# Patient Record
Sex: Female | Born: 1960 | Race: White | Hispanic: No | Marital: Married | Smoking: Former smoker
Health system: Southern US, Community
[De-identification: ages and names within clinical notes are randomized; demographics above are authoritative.]

## PROBLEM LIST (undated history)

## (undated) DIAGNOSIS — J449 Chronic obstructive pulmonary disease, unspecified: Secondary | ICD-10-CM

## (undated) DIAGNOSIS — G40909 Epilepsy, unspecified, not intractable, without status epilepticus: Secondary | ICD-10-CM

## (undated) DIAGNOSIS — C801 Malignant (primary) neoplasm, unspecified: Secondary | ICD-10-CM

## (undated) DIAGNOSIS — R0600 Dyspnea, unspecified: Secondary | ICD-10-CM

## (undated) DIAGNOSIS — G4730 Sleep apnea, unspecified: Secondary | ICD-10-CM

## (undated) DIAGNOSIS — M1990 Unspecified osteoarthritis, unspecified site: Secondary | ICD-10-CM

## (undated) DIAGNOSIS — J189 Pneumonia, unspecified organism: Secondary | ICD-10-CM

## (undated) DIAGNOSIS — E119 Type 2 diabetes mellitus without complications: Secondary | ICD-10-CM

## (undated) HISTORY — PX: BREAST BIOPSY: SHX20

## (undated) HISTORY — PX: CHOLECYSTECTOMY: SHX55

## (undated) HISTORY — PX: COLONOSCOPY: SHX174

## (undated) HISTORY — PX: APPENDECTOMY: SHX54

## (undated) HISTORY — PX: ABDOMINAL HYSTERECTOMY: SHX81

## (undated) HISTORY — PX: JOINT REPLACEMENT: SHX530

---

## 2003-06-19 ENCOUNTER — Other Ambulatory Visit: Payer: Self-pay

## 2003-10-16 ENCOUNTER — Encounter (HOSPITAL_COMMUNITY): Admission: RE | Admit: 2003-10-16 | Discharge: 2003-11-15 | Payer: Self-pay | Admitting: Unknown Physician Specialty

## 2004-09-09 ENCOUNTER — Ambulatory Visit (HOSPITAL_COMMUNITY): Admission: RE | Admit: 2004-09-09 | Discharge: 2004-09-09 | Payer: Self-pay | Admitting: Obstetrics and Gynecology

## 2007-07-29 DIAGNOSIS — F418 Other specified anxiety disorders: Secondary | ICD-10-CM | POA: Insufficient documentation

## 2007-08-08 ENCOUNTER — Ambulatory Visit: Payer: Self-pay | Admitting: Family Medicine

## 2010-08-21 ENCOUNTER — Ambulatory Visit: Payer: Self-pay | Admitting: Family

## 2011-11-11 DIAGNOSIS — N951 Menopausal and female climacteric states: Secondary | ICD-10-CM | POA: Insufficient documentation

## 2011-11-11 DIAGNOSIS — I890 Lymphedema, not elsewhere classified: Secondary | ICD-10-CM | POA: Insufficient documentation

## 2011-11-25 ENCOUNTER — Ambulatory Visit: Payer: Self-pay

## 2013-06-19 ENCOUNTER — Ambulatory Visit: Payer: Self-pay

## 2013-06-23 ENCOUNTER — Ambulatory Visit: Payer: Self-pay

## 2014-01-16 ENCOUNTER — Ambulatory Visit: Payer: Self-pay | Admitting: Unknown Physician Specialty

## 2014-02-09 ENCOUNTER — Ambulatory Visit: Payer: Self-pay | Admitting: Unknown Physician Specialty

## 2014-04-09 ENCOUNTER — Ambulatory Visit: Payer: Self-pay | Admitting: Physician Assistant

## 2014-08-17 DIAGNOSIS — R519 Headache, unspecified: Secondary | ICD-10-CM | POA: Insufficient documentation

## 2014-08-23 ENCOUNTER — Ambulatory Visit: Payer: Self-pay | Admitting: Physician Assistant

## 2016-03-04 ENCOUNTER — Other Ambulatory Visit: Payer: Self-pay | Admitting: Physician Assistant

## 2016-03-04 DIAGNOSIS — M79661 Pain in right lower leg: Secondary | ICD-10-CM

## 2016-03-04 DIAGNOSIS — M7989 Other specified soft tissue disorders: Principal | ICD-10-CM

## 2016-03-05 ENCOUNTER — Ambulatory Visit
Admission: RE | Admit: 2016-03-05 | Discharge: 2016-03-05 | Disposition: A | Discharge status: Home / Self Care | Payer: BLUE CROSS/BLUE SHIELD | Source: Ambulatory Visit | Attending: Physician Assistant | Admitting: Physician Assistant

## 2016-03-05 DIAGNOSIS — M79661 Pain in right lower leg: Secondary | ICD-10-CM

## 2016-03-05 DIAGNOSIS — M7989 Other specified soft tissue disorders: Secondary | ICD-10-CM | POA: Diagnosis present

## 2018-02-23 DIAGNOSIS — I10 Essential (primary) hypertension: Secondary | ICD-10-CM | POA: Insufficient documentation

## 2018-02-28 ENCOUNTER — Other Ambulatory Visit: Payer: Self-pay

## 2018-02-28 ENCOUNTER — Ambulatory Visit: Payer: Self-pay | Attending: Oncology

## 2018-02-28 ENCOUNTER — Ambulatory Visit
Admission: RE | Admit: 2018-02-28 | Discharge: 2018-02-28 | Disposition: A | Discharge status: Home / Self Care | Payer: Self-pay | Source: Ambulatory Visit | Attending: Oncology | Admitting: Oncology

## 2018-02-28 VITALS — BP 115/72 | HR 72 | Temp 98.1°F | Ht 65.0 in | Wt 309.0 lb

## 2018-02-28 DIAGNOSIS — N63 Unspecified lump in unspecified breast: Secondary | ICD-10-CM

## 2018-02-28 DIAGNOSIS — Z0000 Encounter for general adult medical examination without abnormal findings: Secondary | ICD-10-CM | POA: Insufficient documentation

## 2018-02-28 NOTE — Progress Notes (Unsigned)
Letter mailed from [facility] to notify of normal mammogram results.  Patient to return in one year for annual screening.  Copy to [facility]. 

## 2018-02-28 NOTE — Progress Notes (Signed)
  Subjective:     Patient ID: Cheryl Silva, female   DOB: 08/07/1960, 57 y.o.   MRN: [id number]  HPI   Review of Systems     Objective:   Physical Exam  Pulmonary/Chest: Right breast exhibits no inverted nipple, no mass, no nipple discharge, no skin change and no tenderness. Left breast exhibits skin change. Left breast exhibits no inverted nipple, no mass, no nipple discharge and no tenderness. Breasts are symmetrical.  Left breast biopsy scar         Assessment:     57 year old patient presents for [facility] clinic visit.  Patient screened, and meets BCCCP eligibility.  Patient does not have insurance, Medicare or Medicaid.  Handout given on Affordable Care Act.  Instructed patient on breast self awareness using teach back method.  Clinical breast exam unremarkable.  No mass or lump palpated.  Patient states her usband is disabled from a car accident about 6 years ago.  She is his full-time caregiver.  Risk Assessment    Risk Scores      02/28/2018   Last edited by: Theodore Demark, RN   5-year risk: 1.2 %   Lifetime risk: 7.5 %            Plan:     Sent for bilateral screening mammogram.

## 2018-07-18 ENCOUNTER — Ambulatory Visit
Admission: RE | Admit: 2018-07-18 | Discharge: 2018-07-18 | Disposition: A | Discharge status: Home / Self Care | Payer: BLUE CROSS/BLUE SHIELD | Source: Ambulatory Visit | Attending: Physician Assistant | Admitting: Physician Assistant

## 2018-07-18 ENCOUNTER — Other Ambulatory Visit: Payer: Self-pay | Admitting: Physician Assistant

## 2018-07-18 ENCOUNTER — Encounter (INDEPENDENT_AMBULATORY_CARE_PROVIDER_SITE_OTHER): Payer: Self-pay

## 2018-07-18 DIAGNOSIS — M7989 Other specified soft tissue disorders: Secondary | ICD-10-CM | POA: Diagnosis present

## 2018-07-18 DIAGNOSIS — M79661 Pain in right lower leg: Secondary | ICD-10-CM | POA: Insufficient documentation

## 2019-01-23 ENCOUNTER — Other Ambulatory Visit: Payer: Self-pay | Admitting: Physician Assistant

## 2019-01-23 DIAGNOSIS — Z1231 Encounter for screening mammogram for malignant neoplasm of breast: Secondary | ICD-10-CM

## 2020-01-31 ENCOUNTER — Other Ambulatory Visit: Payer: Self-pay | Admitting: Physician Assistant

## 2020-01-31 DIAGNOSIS — Z1231 Encounter for screening mammogram for malignant neoplasm of breast: Secondary | ICD-10-CM

## 2020-02-28 ENCOUNTER — Other Ambulatory Visit: Payer: Self-pay

## 2020-02-28 ENCOUNTER — Ambulatory Visit
Admission: RE | Admit: 2020-02-28 | Discharge: 2020-02-28 | Disposition: A | Discharge status: Home / Self Care | Payer: BC Managed Care – PPO | Source: Ambulatory Visit | Attending: Physician Assistant | Admitting: Physician Assistant

## 2020-02-28 DIAGNOSIS — Z1231 Encounter for screening mammogram for malignant neoplasm of breast: Secondary | ICD-10-CM | POA: Diagnosis not present

## 2020-06-15 HISTORY — PX: MASTECTOMY: SHX3

## 2020-06-20 ENCOUNTER — Other Ambulatory Visit: Payer: Self-pay

## 2020-06-20 ENCOUNTER — Emergency Department (HOSPITAL_COMMUNITY)
Admission: EM | Admit: 2020-06-20 | Discharge: 2020-06-20 | Disposition: A | Discharge status: Home / Self Care | Payer: BC Managed Care – PPO | Attending: Emergency Medicine | Admitting: Emergency Medicine

## 2020-06-20 ENCOUNTER — Encounter (HOSPITAL_COMMUNITY): Payer: Self-pay | Admitting: Emergency Medicine

## 2020-06-20 DIAGNOSIS — Z87891 Personal history of nicotine dependence: Secondary | ICD-10-CM | POA: Insufficient documentation

## 2020-06-20 DIAGNOSIS — E119 Type 2 diabetes mellitus without complications: Secondary | ICD-10-CM | POA: Insufficient documentation

## 2020-06-20 DIAGNOSIS — U071 COVID-19: Secondary | ICD-10-CM

## 2020-06-20 DIAGNOSIS — M791 Myalgia, unspecified site: Secondary | ICD-10-CM | POA: Diagnosis present

## 2020-06-20 HISTORY — DX: Type 2 diabetes mellitus without complications: E11.9

## 2020-06-20 HISTORY — DX: Epilepsy, unspecified, not intractable, without status epilepticus: G40.909

## 2020-06-20 LAB — POC SARS CORONAVIRUS 2 AG -  ED: SARS Coronavirus 2 Ag: POSITIVE — AB

## 2020-06-20 MED ORDER — BENZONATATE 100 MG PO CAPS: 100.0000 mg | ORAL_CAPSULE | Freq: Three times a day (TID) | ORAL | 0 refills | Status: AC

## 2020-06-20 MED ORDER — ALBUTEROL SULFATE HFA 108 (90 BASE) MCG/ACT IN AERS
2.0000 | INHALATION_SPRAY | Freq: Once | RESPIRATORY_TRACT | Status: AC
  Administered 2020-06-20: 2 via RESPIRATORY_TRACT
  Filled 2020-06-20: qty 6.7

## 2020-06-20 NOTE — ED Triage Notes (Signed)
Pt c/o shortness of breath with ambulation. Pt states she has been sick with a cough and fatigue for the last 8 days.

## 2020-06-20 NOTE — Discharge Instructions (Signed)
Take cough medication as directed. You can use two puffs of the albuterol inhaler  every 4-6 hours as needed for shortness of breath.  ° °Stay well hydrated.  ° °Rotate tylenol and motrin for fevers.  ° °You should be isolated for at least 7 days since the onset of your symptoms AND >72 hours after symptoms resolution (absence of fever without the use of fever reducing medicaiton and improvement in respiratory symptoms), whichever is longer ° °Please follow up with your primary care provider within 5-7 days for re-evaluation of your symptoms. If you do not have a primary care provider, information for a healthcare clinic has been provided for you to make arrangements for follow up care. Please return to the emergency department for any new or worsening symptoms. ° °

## 2020-06-20 NOTE — ED Provider Notes (Signed)
[facility] EMERGENCY DEPARTMENT Provider Note   CSN: [id number] Arrival date & time: 06/20/20  1443     History Chief Complaint  Patient presents with  . Shortness of Breath    Cheryl Silva is a 60 y.o. female.  HPI   60 year old female with a history of diabetes, epilepsy, who presents to the emergency department today for evaluation of URI symptoms.  States she started having a headache 8 days ago.  Since then she has developed body aches, fevers, fatigue, cough and shortness of breath.  She has had decreased p.o. intake as well as some diarrhea.  There is been no vomiting.  She has been vaccinated with the [employer] vaccine.  She is not sure if she has had any direct Covid exposures but states her daughter is a Marine scientist and had a headache prior to the onset of her symptoms with the daughter was not tested for Covid.  Past Medical History:  Diagnosis Date  . Diabetes mellitus without complication ([place])   . Epilepsy ([place])     There are no problems to display for this patient.   Past Surgical History:  Procedure Laterality Date  . ABDOMINAL HYSTERECTOMY    . APPENDECTOMY    . BREAST BIOPSY Right    benign 15 years ago  . CHOLECYSTECTOMY       OB History   No obstetric history on file.     Family History  Problem Relation Age of Onset  . Breast cancer Maternal Aunt 59    Social History   Tobacco Use  . Smoking status: Former Smoker    Packs/day: 1.00    Years: 15.00    Pack years: 15.00    Types: Cigarettes  . Smokeless tobacco: Never Used  . Tobacco comment: quit 20 years ago  Vaping Use  . Vaping Use: Never used  Substance Use Topics  . Alcohol use: Not Currently  . Drug use: Not Currently    Home Medications Prior to Admission medications   Medication Sig Start Date End Date Taking? Authorizing Provider  benzonatate (TESSALON) 100 MG capsule Take 1 capsule (100 mg total) by mouth every 8 (eight) hours for 5 days. 06/20/20 06/25/20 Yes Naomii Kreger,  Lilliana Turner S, PA-C    Allergies    Vicodin [hydrocodone-acetaminophen]  Review of Systems   Review of Systems  Constitutional: Positive for appetite change, chills, fatigue and fever.  HENT: Negative for ear pain.   Eyes: Negative for pain and visual disturbance.  Respiratory: Positive for cough and shortness of breath.   Cardiovascular: Negative for chest pain.  Gastrointestinal: Positive for diarrhea. Negative for abdominal pain, constipation, nausea and vomiting.  Genitourinary: Negative for dysuria and hematuria.  Musculoskeletal: Positive for myalgias. Negative for back pain.  Skin: Negative for rash.  Neurological: Positive for weakness (generally weak). Negative for seizures and syncope.  All other systems reviewed and are negative.   Physical Exam Updated Vital Signs BP (!) 143/83 (BP Location: Right Arm)   Pulse 78   Temp 98.5 F (36.9 C) (Oral)   Resp 17   Ht 5\' 5"  (1.651 m)   Wt 134.9 kg   SpO2 97%   BMI 49.51 kg/m   Physical Exam Vitals and nursing note reviewed.  Constitutional:      General: She is not in acute distress.    Appearance: She is well-developed and well-nourished.  HENT:     Head: Normocephalic and atraumatic.  Eyes:     Conjunctiva/sclera: Conjunctivae normal.  Cardiovascular:     Rate and Rhythm: Normal rate and regular rhythm.     Heart sounds: Normal heart sounds. No murmur heard.   Pulmonary:     Effort: Pulmonary effort is normal. No respiratory distress.     Breath sounds: Normal breath sounds. No decreased breath sounds, wheezing, rhonchi or rales.  Abdominal:     Palpations: Abdomen is soft.     Tenderness: There is no abdominal tenderness.  Musculoskeletal:        General: No edema.     Cervical back: Neck supple.  Skin:    General: Skin is warm and dry.  Neurological:     Mental Status: She is alert.  Psychiatric:        Mood and Affect: Mood and affect normal.     ED Results / Procedures / Treatments   Labs (all  labs ordered are listed, but only abnormal results are displayed) Labs Reviewed  POC SARS CORONAVIRUS 2 AG -  ED - Abnormal; Notable for the following components:      Result Value   SARS Coronavirus 2 Ag POSITIVE (*)    All other components within normal limits    EKG None  Radiology No results found.  Procedures Procedures (including critical care time)  Medications Ordered in ED Medications  albuterol (VENTOLIN HFA) 108 (90 Base) MCG/ACT inhaler 2 puff (has no administration in time range)    ED Course  I have reviewed the triage vital signs and the nursing notes.  Pertinent labs & imaging results that were available during my care of the patient were reviewed by me and considered in my medical decision making (see chart for details).    MDM Rules/Calculators/A&P                          Patient presenting for evaluation for Covid.  Reports symptoms ongoing for 8 days.  Patient nontoxic, well-appearing, no distress.  Vital signs are reassuring.   Tested for Covid in the ED. Results positive. Ambulated pt in the room on pulse ox and sats stayed at 96% on RA.  Advised on quarantine measures. Will give Rx for symptomatic management. Advised on f/u and return precautions. Pt voiced understanding of the plan and reasons to return. All questions answered, pt stable for d/c.  Cheryl Silva was evaluated in Emergency Department on 06/20/2020 for the symptoms described in the history of present illness. She was evaluated in the context of the global COVID-19 pandemic, which necessitated consideration that the patient might be at risk for infection with the SARS-CoV-2 virus that causes COVID-19. Institutional protocols and algorithms that pertain to the evaluation of patients at risk for COVID-19 are in a state of rapid change based on information released by regulatory bodies including the CDC and federal and state organizations. These policies and algorithms were followed during the patient's  care in the ED.   Final Clinical Impression(s) / ED Diagnoses Final diagnoses:  COVID    Rx / DC Orders ED Discharge Orders         Ordered    benzonatate (TESSALON) 100 MG capsule  Every 8 hours        06/20/20 1544           Orra Nolde S, PA-C 06/20/20 1548    Mancel Bale, MD 06/20/20 2329

## 2020-06-21 ENCOUNTER — Emergency Department (HOSPITAL_COMMUNITY)
Admission: EM | Admit: 2020-06-21 | Discharge: 2020-06-22 | Disposition: A | Discharge status: Home / Self Care | Payer: BC Managed Care – PPO | Attending: Emergency Medicine | Admitting: Emergency Medicine

## 2020-06-21 ENCOUNTER — Emergency Department (HOSPITAL_COMMUNITY): Payer: BC Managed Care – PPO

## 2020-06-21 ENCOUNTER — Encounter (HOSPITAL_COMMUNITY): Payer: Self-pay

## 2020-06-21 ENCOUNTER — Other Ambulatory Visit: Payer: Self-pay

## 2020-06-21 DIAGNOSIS — Z87891 Personal history of nicotine dependence: Secondary | ICD-10-CM | POA: Diagnosis not present

## 2020-06-21 DIAGNOSIS — E860 Dehydration: Secondary | ICD-10-CM | POA: Diagnosis not present

## 2020-06-21 DIAGNOSIS — U071 COVID-19: Secondary | ICD-10-CM

## 2020-06-21 DIAGNOSIS — E119 Type 2 diabetes mellitus without complications: Secondary | ICD-10-CM | POA: Diagnosis not present

## 2020-06-21 DIAGNOSIS — R197 Diarrhea, unspecified: Secondary | ICD-10-CM | POA: Diagnosis present

## 2020-06-21 LAB — COMPREHENSIVE METABOLIC PANEL
ALT: 23 U/L (ref 0–44)
AST: 19 U/L (ref 15–41)
Albumin: 3.7 g/dL (ref 3.5–5.0)
Alkaline Phosphatase: 67 U/L (ref 38–126)
Anion gap: 10 (ref 5–15)
BUN: 11 mg/dL (ref 6–20)
CO2: 22 mmol/L (ref 22–32)
Calcium: 8.7 mg/dL — ABNORMAL LOW (ref 8.9–10.3)
Chloride: 108 mmol/L (ref 98–111)
Creatinine, Ser: 0.67 mg/dL (ref 0.44–1.00)
GFR, Estimated: >60 mL/min (ref >60)
Glucose, Bld: 193 mg/dL — ABNORMAL HIGH (ref 70–99)
Potassium: 3.7 mmol/L (ref 3.5–5.1)
Sodium: 140 mmol/L (ref 135–145)
Total Bilirubin: 0.9 mg/dL (ref 0.3–1.2)
Total Protein: 6.8 g/dL (ref 6.5–8.1)

## 2020-06-21 LAB — CBC WITH DIFFERENTIAL/PLATELET
Abs Immature Granulocytes: 0.02 10*3/uL (ref 0.00–0.07)
Basophils Absolute: 0 10*3/uL (ref 0.0–0.1)
Basophils Relative: 0 %
Eosinophils Absolute: 0.2 10*3/uL (ref 0.0–0.5)
Eosinophils Relative: 3 %
HCT: 43.6 % (ref 36.0–46.0)
Hemoglobin: 14 g/dL (ref 12.0–15.0)
Immature Granulocytes: 0 %
Lymphocytes Relative: 19 %
Lymphs Abs: 1.3 10*3/uL (ref 0.7–4.0)
MCH: 30.2 pg (ref 26.0–34.0)
MCHC: 32.1 g/dL (ref 30.0–36.0)
MCV: 94 fL (ref 80.0–100.0)
Monocytes Absolute: 0.5 10*3/uL (ref 0.1–1.0)
Monocytes Relative: 7 %
Neutro Abs: 4.9 10*3/uL (ref 1.7–7.7)
Neutrophils Relative %: 71 %
Platelets: 162 10*3/uL (ref 150–400)
RBC: 4.64 MIL/uL (ref 3.87–5.11)
RDW: 12.5 % (ref 11.5–15.5)
WBC: 6.9 10*3/uL (ref 4.0–10.5)
nRBC: 0 % (ref 0.0–0.2)

## 2020-06-21 MED ORDER — LACTATED RINGERS IV BOLUS
500.0000 mL | Freq: Once | INTRAVENOUS | Status: AC
  Administered 2020-06-21: 500 mL via INTRAVENOUS

## 2020-06-21 NOTE — ED Notes (Signed)
Pt ambulated O2 sats 97% did not drop. Pt also stated felt much better than before

## 2020-06-21 NOTE — ED Triage Notes (Signed)
Pt to er, pt states that she was here last night and dx with covid, states that she was told that if her O2 saturation went down to come back, states that her O2 sat has been as low as 77, states that most of the day it has been between 88 and 94, states that she gets more sob with exertion.

## 2020-06-21 NOTE — Discharge Instructions (Signed)
Today your blood sugar was high at 193, however the rest of your blood work was reassuring.  Suspect you are dehydrated from not drinking enough water to make up for the diarrhea that you are having.  Please increase your fluid intake.  Sodas and milk do not rehydrate you.  Ginger ale may help settle your stomach however otherwise I would recommend water, half water half Gatorade mix or half water half juice mix.    If your oxygen is staying below 90% then return to the emergency room.  Please give your body time to adjust with any position change.

## 2020-06-21 NOTE — ED Notes (Signed)
Pt ambulated to restroom with 02 staying at 95%. Pt still complained of SOB while walking.

## 2020-06-21 NOTE — ED Notes (Signed)
Pt ambulated from waiting room to pt room 8. O2 sats 95%

## 2020-06-21 NOTE — ED Provider Notes (Signed)
[facility] EMERGENCY DEPARTMENT Provider Note   CSN: [id number] Arrival date & time: 06/21/20  1912     History Chief Complaint  Patient presents with  . Shortness of Breath     Cheryl Silva is a 60 y.o. female with a past medical history of epilepsy, DM, who presents today for evaluation of concern for hypoxia. She was seen yesterday for COVID-19. Today she is on day 9 of symptoms. She got the J&J vaccine however did not get any boosters. She states that she has been checking her oxygen at home and it has been as low as 77 at points during the day. She reports that she is continuing to have diarrhea. She states that anytime she eats anything she has diarrhea. She states she feels dizzy when she stands.  She reports compliance with all of her medications.  She denies significant chest pain. No leg swelling.  HPI     Past Medical History:  Diagnosis Date  . Diabetes mellitus without complication ([facility])   . Epilepsy ([facility])     There are no problems to display for this patient.   Past Surgical History:  Procedure Laterality Date  . ABDOMINAL HYSTERECTOMY    . APPENDECTOMY    . BREAST BIOPSY Right    benign 15 years ago  . CHOLECYSTECTOMY       OB History   No obstetric history on file.     Family History  Problem Relation Age of Onset  . Breast cancer Maternal Aunt 66    Social History   Tobacco Use  . Smoking status: Former Smoker    Packs/day: 1.00    Years: 15.00    Pack years: 15.00    Types: Cigarettes  . Smokeless tobacco: Never Used  . Tobacco comment: quit 20 years ago  Vaping Use  . Vaping Use: Never used  Substance Use Topics  . Alcohol use: Not Currently  . Drug use: Not Currently    Home Medications Prior to Admission medications   Medication Sig Start Date End Date Taking? Authorizing Provider  albuterol (VENTOLIN HFA) 108 (90 Base) MCG/ACT inhaler Inhale into the lungs.   Yes [provider]  benzonatate (TESSALON) 100 MG  capsule Take 1 capsule (100 mg total) by mouth every 8 (eight) hours for 5 days. 06/20/20 06/25/20 Yes Couture, Cortni S, PA-C  fluticasone (FLOVENT HFA) 220 MCG/ACT inhaler Inhale 2 puffs into the lungs daily as needed.   Yes [provider]  gabapentin (NEURONTIN) 300 MG capsule Take 1 capsule by mouth daily. 03/21/19  Yes [provider]  JARDIANCE 10 MG TABS tablet Take 10 mg by mouth daily. 05/24/20  Yes [provider]  losartan (COZAAR) 25 MG tablet Take 1 tablet by mouth daily. 03/21/19  Yes [provider]  sertraline (ZOLOFT) 100 MG tablet Take 150 mg by mouth at bedtime.   Yes [provider]  furosemide (LASIX) 20 MG tablet Take by mouth. Patient not taking: No sig reported 04/10/19   [provider]  montelukast (SINGULAIR) 10 MG tablet  07/18/18   [provider]    Allergies    Vicodin [hydrocodone-acetaminophen]  Review of Systems   Review of Systems  Constitutional: Positive for appetite change and fatigue. Negative for chills and fever.  HENT: Negative for trouble swallowing.   Respiratory: Positive for cough and shortness of breath. Negative for chest tightness.   Cardiovascular: Positive for chest pain (Mild).  Gastrointestinal: Positive for diarrhea and nausea. Negative  for abdominal pain and vomiting.  Musculoskeletal: Positive for arthralgias and myalgias.  Skin: Negative for color change and rash.  Neurological: Positive for light-headedness and headaches. Negative for numbness. Weakness: Global.  Psychiatric/Behavioral: Negative for confusion.  All other systems reviewed and are negative.   Physical Exam Updated Vital Signs BP 130/72 (BP Location: Right Arm)   Pulse 70   Temp 98.4 F (36.9 C)   Resp 18   Ht 5\' 5"  (1.651 m)   Wt 134.7 kg   SpO2 96%   BMI 49.42 kg/m   Physical Exam Vitals and nursing note reviewed.  Constitutional:      General: She is not in acute distress.    Appearance: She  is ill-appearing.  HENT:     Head: Normocephalic and atraumatic.  Eyes:     Conjunctiva/sclera: Conjunctivae normal.  Cardiovascular:     Rate and Rhythm: Normal rate.  Pulmonary:     Effort: Pulmonary effort is normal. No accessory muscle usage or respiratory distress.  Chest:     Chest wall: Tenderness present.  Abdominal:     Palpations: Abdomen is soft.     Tenderness: There is no abdominal tenderness.  Musculoskeletal:     Cervical back: Normal range of motion and neck supple.     Right lower leg: No tenderness.     Left lower leg: No tenderness.  Skin:    General: Skin is warm.  Neurological:     Mental Status: She is alert.     Comments: Awake and alert, answers all questions appropriately.  Speech is not slurred.  Psychiatric:        Mood and Affect: Mood normal.        Behavior: Behavior normal.     ED Results / Procedures / Treatments   Labs (all labs ordered are listed, but only abnormal results are displayed) Labs Reviewed  COMPREHENSIVE METABOLIC PANEL - Abnormal; Notable for the following components:      Result Value   Glucose, Bld 193 (*)    Calcium 8.7 (*)    All other components within normal limits  CBC WITH DIFFERENTIAL/PLATELET    EKG None  Radiology DG Chest Portable 1 View  Result Date: 06/21/2020 CLINICAL DATA:  Shortness of breath.  COVID. EXAM: PORTABLE CHEST 1 VIEW COMPARISON:  None. FINDINGS: Vague opacities in the periphery of both lung bases and right midlung zone. Heart is normal in size. Normal mediastinal contours. No evidence of pneumomediastinum or pneumothorax. No pleural fluid or pulmonary edema. No acute osseous abnormalities are seen. IMPRESSION: Vague bilateral lung opacities, pattern consistent with COVID-19 pneumonia. Electronically Signed   By: Narda Rutherford M.D.   On: 06/21/2020 22:55   Orthostatic VS for the past 24 hrs:  BP- Lying Pulse- Lying BP- Sitting Pulse- Sitting BP- Standing at 0 minutes Pulse- Standing at 0  minutes  06/21/20 2300 129/55 72 139/74 87 -- --  06/21/20 2132 128/60 71 143/70 81 118/71 102      Procedures Procedures (including critical care time)  Medications Ordered in ED Medications  lactated ringers bolus 500 mL (0 mLs Intravenous Stopped 06/21/20 2349)    ED Course  I have reviewed the triage vital signs and the nursing notes.  Pertinent labs & imaging results that were available during my care of the patient were reviewed by me and considered in my medical decision making (see chart for details).    MDM Rules/Calculators/A&P  Cheryl Silva was evaluated in Emergency Department on 06/22/2020 for the symptoms described in the history of present illness. She was evaluated in the context of the global COVID-19 pandemic, which necessitated consideration that the patient might be at risk for infection with the SARS-CoV-2 virus that causes COVID-19. Institutional protocols and algorithms that pertain to the evaluation of patients at risk for COVID-19 are in a state of rapid change based on information released by regulatory bodies including the CDC and federal and state organizations. These policies and algorithms were followed during the patient's care in the ED.  Patient is a 60 year old woman who presents today for concern of hypoxia. She is on day 9 of Covid and reports at home that her pulse oxygen sensor has been into the 70s at times. Labs are obtained and reviewed given her reported significant diarrhea without significant acute hematologic or electrolyte abnormalities. Chest x-ray shows bilateral opacities consistent with Covid. When I originally attempted to have patient stand and ambulate she felt very lightheaded and fatigued very quickly despite not becoming hypoxic. Orthostatics are obtained, she was not significantly orthostatic originally however did have heart rate changes with position changes. She is allowed to drink fluids and given a 500 cc  fluid bolus after which this was repeated. She stated that she felt much better after the p.o. and IV fluids, no longer felt lightheaded when standing, and was able to ambulate with her oxygen sats remaining 97%.  No indication for Covid admission at this time. Recommend to continue p.o. hydration. We also discussed use of pulse oximeter.  Return precautions were discussed with patient who states their understanding.  At the time of discharge patient denied any unaddressed complaints or concerns.  Patient is agreeable for discharge home.  Note: Portions of this report may have been transcribed using voice recognition software. Every effort was made to ensure accuracy; however, inadvertent computerized transcription errors may be present   Final Clinical Impression(s) / ED Diagnoses Final diagnoses:  COVID-19  Dehydration    Rx / DC Orders ED Discharge Orders    None       Norman Clay 06/22/20 Virginia Rochester, MD 06/23/20 2141

## 2021-01-30 ENCOUNTER — Other Ambulatory Visit: Payer: Self-pay | Admitting: Physician Assistant

## 2021-03-04 ENCOUNTER — Ambulatory Visit: Payer: Self-pay

## 2021-04-02 ENCOUNTER — Ambulatory Visit: Payer: Self-pay | Attending: Oncology

## 2021-04-02 ENCOUNTER — Other Ambulatory Visit: Payer: Self-pay

## 2021-04-02 ENCOUNTER — Ambulatory Visit
Admission: RE | Admit: 2021-04-02 | Discharge: 2021-04-02 | Disposition: A | Discharge status: Home / Self Care | Payer: Self-pay | Source: Ambulatory Visit | Attending: Oncology | Admitting: Oncology

## 2021-04-02 DIAGNOSIS — Z0000 Encounter for general adult medical examination without abnormal findings: Secondary | ICD-10-CM

## 2021-04-03 NOTE — Progress Notes (Signed)
Patient pre-screened for BCCCP eligibility due to COVID 19 precautions. Two patient identifiers used for verification that I was speaking to correct patient.  Patient to Present directly to [facility] 04/02/21 for BCCCP screening mammogram.

## 2021-04-08 ENCOUNTER — Other Ambulatory Visit: Payer: Self-pay

## 2021-04-08 DIAGNOSIS — N6320 Unspecified lump in the left breast, unspecified quadrant: Secondary | ICD-10-CM

## 2021-04-17 ENCOUNTER — Ambulatory Visit
Admission: RE | Admit: 2021-04-17 | Discharge: 2021-04-17 | Disposition: A | Discharge status: Home / Self Care | Payer: Medicaid Other | Source: Ambulatory Visit | Attending: Oncology | Admitting: Oncology

## 2021-04-17 ENCOUNTER — Other Ambulatory Visit: Payer: Self-pay

## 2021-04-17 DIAGNOSIS — N6320 Unspecified lump in the left breast, unspecified quadrant: Secondary | ICD-10-CM

## 2021-04-17 DIAGNOSIS — R920 Mammographic microcalcification found on diagnostic imaging of breast: Secondary | ICD-10-CM

## 2021-04-19 ENCOUNTER — Encounter: Payer: Self-pay | Admitting: Emergency Medicine

## 2021-04-19 ENCOUNTER — Ambulatory Visit
Admission: EM | Admit: 2021-04-19 | Discharge: 2021-04-19 | Disposition: A | Discharge status: Home / Self Care | Payer: Self-pay | Attending: Family Medicine | Admitting: Family Medicine

## 2021-04-19 ENCOUNTER — Other Ambulatory Visit: Payer: Self-pay

## 2021-04-19 DIAGNOSIS — R509 Fever, unspecified: Secondary | ICD-10-CM

## 2021-04-19 DIAGNOSIS — R051 Acute cough: Secondary | ICD-10-CM

## 2021-04-19 MED ORDER — PROMETHAZINE-DM 6.25-15 MG/5ML PO SYRP: 5.0000 mL | ORAL_SOLUTION | Freq: Four times a day (QID) | ORAL | 0 refills | Status: DC | PRN

## 2021-04-19 MED ORDER — LEVOFLOXACIN 750 MG PO TABS
750.0000 mg | ORAL_TABLET | Freq: Every day | ORAL | 0 refills | Status: DC
Start: 2021-04-19 — End: 2021-05-16

## 2021-04-19 NOTE — ED Triage Notes (Signed)
Pt is present today with cough and SOB. Pt states that she noticed that after coughing last night she saw blood. Pt states sx started x2 weeks ago

## 2021-04-21 NOTE — ED Provider Notes (Signed)
[name]   [id number] 04/19/21 Arrival Time: 2778  ASSESSMENT & PLAN:  1. Acute cough   2. Fever, unspecified    No CXR availability here today. Will empirically tx for PNA. Agrees to ED visit should any symptoms worsen. No resp distress. OTC symptom care as needed.  Meds ordered this encounter  Medications   levofloxacin (LEVAQUIN) 750 MG tablet    Sig: Take 1 tablet (750 mg total) by mouth daily.    Dispense:  7 tablet    Refill:  0   promethazine-dextromethorphan (PROMETHAZINE-DM) 6.25-15 MG/5ML syrup    Sig: Take 5 mLs by mouth 4 (four) times daily as needed for cough.    Dispense:  118 mL    Refill:  0     Follow-up Information     [facility] EMERGENCY DEPARTMENT.   Specialty: Emergency Medicine Why: If symptoms worsen in any way. Contact information: [address] [phone] Cheryl Silva [place] [address] [phone]                Reviewed expectations re: course of current medical issues. Questions answered. Outlined signs and symptoms indicating need for more acute intervention. Understanding verbalized. After Visit Summary given.   SUBJECTIVE: History from: patient. Cheryl Silva is a 60 y.o. female who reports: cough and chest cong; x 1-2 w; subj fever yesterday; occas SOB; no CP. Normal PO intake without n/v/d.  Social History   Tobacco Use  Smoking Status Former   Packs/day: 1.00   Years: 15.00   Pack years: 15.00   Types: Cigarettes  Smokeless Tobacco Never  Tobacco Comments   quit 20 years ago     OBJECTIVE:  Vitals:   04/19/21 1327  BP: 127/81  Pulse: 82  Resp: 19  Temp: 98.4 F (36.9 C)  SpO2: 94%    General appearance: alert; no distress Eyes: PERRLA; EOMI; conjunctiva normal HENT: [place]; AT; with nasal congestion Neck: supple  Lungs: speaks full sentences without difficulty; unlabored; coarse breath sounds bilaterally Extremities: no edema Skin: warm and dry Neurologic: normal  gait Psychological: alert and cooperative; normal mood and affect   Allergies  Allergen Reactions   Vicodin [Hydrocodone-Acetaminophen] Itching    Past Medical History:  Diagnosis Date   Diabetes mellitus without complication ([place])    Epilepsy ([place])    Social History   Socioeconomic History   Marital status: Married    Spouse name: Not on file   Number of children: Not on file   Years of education: Not on file   Highest education level: Not on file  Occupational History   Not on file  Tobacco Use   Smoking status: Former    Packs/day: 1.00    Years: 15.00    Pack years: 15.00    Types: Cigarettes   Smokeless tobacco: Never   Tobacco comments:    quit 20 years ago  Vaping Use   Vaping Use: Never used  Substance and Sexual Activity   Alcohol use: Not Currently   Drug use: Not Currently   Sexual activity: Not Currently  Other Topics Concern   Not on file  Social History Narrative   Not on file   Social Determinants of Health   Financial Resource Strain: Not on file  Food Insecurity: Not on file  Transportation Needs: Not on file  Physical Activity: Not on file  Stress: Not on file  Social Connections: Not on file  Intimate Partner Violence: Not on file   Family History  Problem Relation Age of Onset   Breast cancer Maternal Aunt 60   Past Surgical History:  Procedure Laterality Date   ABDOMINAL HYSTERECTOMY     APPENDECTOMY     BREAST BIOPSY Right    benign 15 years ago   Cheryl Carpenter, MD 04/21/21 [phone]

## 2021-05-05 ENCOUNTER — Other Ambulatory Visit: Payer: Self-pay

## 2021-05-05 ENCOUNTER — Ambulatory Visit
Admission: RE | Admit: 2021-05-05 | Discharge: 2021-05-05 | Disposition: A | Discharge status: Home / Self Care | Payer: Medicaid Other | Source: Ambulatory Visit | Attending: Oncology | Admitting: Oncology

## 2021-05-05 DIAGNOSIS — N6324 Unspecified lump in the left breast, lower inner quadrant: Secondary | ICD-10-CM | POA: Diagnosis not present

## 2021-05-05 DIAGNOSIS — R920 Mammographic microcalcification found on diagnostic imaging of breast: Secondary | ICD-10-CM

## 2021-05-05 DIAGNOSIS — N6320 Unspecified lump in the left breast, unspecified quadrant: Secondary | ICD-10-CM | POA: Diagnosis present

## 2021-05-05 HISTORY — PX: BREAST BIOPSY: SHX20

## 2021-05-06 DIAGNOSIS — C50919 Malignant neoplasm of unspecified site of unspecified female breast: Secondary | ICD-10-CM

## 2021-05-07 NOTE — Progress Notes (Signed)
Navigation initiated with this BCCCP patient.  Scheduled for Med/Onc consult with Dr. Rogue Bussing.  Patient will complete BCCM application next week.

## 2021-05-12 LAB — SURGICAL PATHOLOGY

## 2021-05-12 NOTE — Progress Notes (Signed)
Patient to complete [facility] application at Dr, Bary Castilla appointment today. Reviewed upcoming appointments.

## 2021-05-15 ENCOUNTER — Other Ambulatory Visit: Payer: Self-pay | Admitting: General Surgery

## 2021-05-15 DIAGNOSIS — Z170 Estrogen receptor positive status [ER+]: Secondary | ICD-10-CM

## 2021-05-15 DIAGNOSIS — C50912 Malignant neoplasm of unspecified site of left female breast: Secondary | ICD-10-CM

## 2021-05-16 ENCOUNTER — Other Ambulatory Visit: Payer: Self-pay

## 2021-05-16 ENCOUNTER — Inpatient Hospital Stay: Payer: Medicaid Other | Attending: Internal Medicine | Admitting: Internal Medicine

## 2021-05-16 ENCOUNTER — Inpatient Hospital Stay: Payer: Medicaid Other

## 2021-05-16 ENCOUNTER — Other Ambulatory Visit: Payer: Self-pay | Admitting: General Surgery

## 2021-05-16 DIAGNOSIS — Z170 Estrogen receptor positive status [ER+]: Secondary | ICD-10-CM | POA: Insufficient documentation

## 2021-05-16 DIAGNOSIS — C50312 Malignant neoplasm of lower-inner quadrant of left female breast: Secondary | ICD-10-CM | POA: Insufficient documentation

## 2021-05-16 NOTE — Progress Notes (Signed)
Subjective:     Patient ID: Cheryl Silva is a 60 y.o. female.   HPI   The following portions of the patient's history were reviewed and updated as appropriate.   This a new patient is here today for: office visit. The patient has been referred by Al Pimple, RN for evaluation of a newly diagnosed left breast cancer. Patient reports she had a mammogram done last year in September but prior to that she had not had one since 2019.   The patient reports her bra size is a 42 DD.    The patient is a primary caregiver for her husband who suffered a traumatic brain injury while driving a tanker trailer 10 years ago.  He has significant short-term memory issues.   Patient is accompanied today by her husband, [name], daughter, [name], and sister-in-law, [name].  Ms. Martinique had been cared for by Dr. Jamal Collin in the past.      Chief Complaint  Patient presents with   Treatment Plan Discussion      BP 118/70   Pulse 67   Temp 36.8 C (98.2 F)   Ht 162.6 cm (5' 4")   Wt (!) 137 kg (302 lb)   SpO2 94%   BMI 51.84 kg/m        Past Medical History:  Diagnosis Date   COPD (chronic obstructive pulmonary disease) (CMS-HCC)     Degenerative joint disease     Diabetes mellitus type 2, uncomplicated (CMS-HCC)     Eczema, unspecified     Epilepsy (CMS-HCC)     History of menstrual disorder     Hyperlipidemia     Hypertension     SOB (shortness of breath)             Past Surgical History:  Procedure Laterality Date   APPENDECTOMY       BIOPSY BREAST W/ LOC DEVICE PLACEMENT AND STEREOTACTIC GUIDANCE Left 05/05/2021   CHOLECYSTECTOMY       HYSTERECTOMY       INCISIONAL BIOPSY BREAST Right      benign   JOINT REPLACEMENT Right 06/20/2003    Right total knee replacement.   KNEE ARTHROSCOPY Right 06/15/2002    Arthroscopy right knee 2004 with debridement of chondral lesions.   Medial compartment MAKOplasty procedure on the left Left 03/12/2014   ultrasound guided core  breast biopsy Left 05/05/2021                OB History     Gravida  3   Para  2   Term      Preterm      AB      Living         SAB      IAB      Ectopic      Molar      Multiple      Live Births           Obstetric Comments  Age at first period 36 Age of first pregnancy 58             Social History           Socioeconomic History   Marital status: Married  Tobacco Use   Smoking status: Former      Packs/day: 1.00      Years: 15.00      Pack years: 15.00      Types: Cigarettes   Smokeless tobacco: Never  Media planner  Vaping Use: Never used  Substance and Sexual Activity   Alcohol use: No      Alcohol/week: 0.0 standard drinks   Drug use: No            Allergies  Allergen Reactions   Codeine Sulfate Unknown   Vicodin [Hydrocodone-Acetaminophen] Itching      Current Medications        Current Outpatient Medications  Medication Sig Dispense Refill   albuterol 90 mcg/actuation inhaler Inhale 2 inhalations into the lungs       empagliflozin (JARDIANCE) 10 mg tablet Take 1 tablet (10 mg total) by mouth once daily       fluticasone propionate (FLOVENT HFA) 220 mcg/actuation inhaler Inhale 1 inhalation into the lungs 2 (two) times daily       gabapentin (NEURONTIN) 300 MG capsule Take 1 capsule by mouth once daily       montelukast (SINGULAIR) 10 mg tablet         sertraline (ZOLOFT) 100 MG tablet Take 100 mg by mouth once daily       FUROsemide (LASIX) 20 MG tablet Take 1 tablet (20 mg total) by mouth once daily 30 tablet 11   losartan (COZAAR) 25 MG tablet Take 1 tablet by mouth once daily (Patient not taking: Reported on 05/12/2021)       sitaGLIPtin-metFORMIN (JANUMET) 50-1,000 mg tablet Take 1 tablet by mouth 2 (two) times daily with meals (Patient not taking: Reported on 05/12/2021)       tiZANidine (ZANAFLEX) 2 MG tablet  (Patient not taking: Reported on 05/12/2021)        No current facility-administered medications for this  visit.             Family History  Problem Relation Age of Onset   Heart disease Mother     Coronary Artery Disease (Blocked arteries around heart) Mother     Heart disease Brother     Coronary Artery Disease (Blocked arteries around heart) Brother     Myocardial Infarction (Heart attack) Brother     Breast cancer Maternal Aunt 60        Labs and Radiology:    Mammogram review:   Mammograms from September 2019 through May 05, 2021 were independently reviewed as well as associated ultrasound images.   Clear study 2019.  In retrospect, developing density in the site of presently confirmed invasive mammary carcinoma in 2021, too small to call at that time.   Calcifications which were benign on biopsy were developing in 2019 and progressed since that date.  (Patient is unaware of any clear history of trauma).   Pathology review:   DIAGNOSIS:  A. LEFT BREAST, 8:30; ULTRASOUND-GUIDED BIOPSY:  - INVASIVE MAMMARY CARCINOMA, NO SPECIAL TYPE.   Size of invasive carcinoma: 4 mm in this sample  Histologic grade of invasive carcinoma: Grade 2                       Glandular/tubular differentiation score: 3                       Nuclear pleomorphism score: 2                       Mitotic rate score: 1                       Total score: 6  Ductal carcinoma in situ: Not identified  Lymphovascular invasion: Not identified   Comment:  The definitive grade will be assigned on the excisional specimen.  ER/PR/HER2 immunohistochemistry will be performed on block A2, with  reflex to [place] for HER2 2+. The results will be reported in an addendum.   B. LEFT BREAST, LOWER INNER QUADRANT CALCIFICATIONS; STEREOTACTIC  BIOPSY:  - FIBROCYSTIC CHANGES WITH ASSOCIATED COARSE CALCIFICATIONS.  - NEGATIVE FOR ATYPIA AND MALIGNANCY.    ADDENDUM:  CASE SUMMARY: BREAST BIOMARKER TESTS  Estrogen Receptor (ER) Status: POSITIVE          Percentage of cells with nuclear positivity: 91-100%           Average intensity of staining: Strong   Progesterone Receptor (PgR) Status: POSITIVE          Percentage of cells with nuclear positivity: 81-90%          Average intensity of staining: Moderate   HER2 (by immunohistochemistry): NEGATIVE (Score 0)  Ki-67: Not performed  Of note, the addendum with the receptor status was not available at the time of the patient's visit it became available after she had left the office.   Ultrasound:   Limited ultrasound of the lower inner quadrant of the left breast was undertaken to determine if preoperative wire localization would be required if the patient chose breast conservation.  There is enough distortion from the 2 adjacent biopsy sites to make wire localization necessary.  No images.     Review of Systems  Constitutional: Negative for chills and fever.  Respiratory: Negative for cough.          Objective:   Physical Exam Exam conducted with a chaperone present.  Constitutional:      Appearance: Normal appearance.  Cardiovascular:     Rate and Rhythm: Normal rate and regular rhythm.     Pulses: Normal pulses.     Heart sounds: Normal heart sounds.  Pulmonary:     Effort: Pulmonary effort is normal.     Breath sounds: Normal breath sounds.  Chest:  Breasts:    Right: Normal.     Left: Normal.       Comments: Mild bruising without palpable hematoma lower inner quadrant of the left breast. Musculoskeletal:     Cervical back: Neck supple.  Lymphadenopathy:     Upper Body:     Right upper body: No supraclavicular or axillary adenopathy.     Left upper body: No supraclavicular or axillary adenopathy.  Skin:    General: Skin is warm and dry.  Neurological:     Mental Status: She is alert and oriented to person, place, and time.  Psychiatric:        Mood and Affect: Mood normal.        Behavior: Behavior normal.           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    Plan:     Over 1 hour was spent during the patient's review of images, patient interview, family instruction and post visit data review.  The patient's sister-in-law underwent mastectomy for a very low-grade, small tumor in the distant past, I think this is significantly influencing the patient's present leaning in this regard.  We had a long discussion regarding the equivalency of breast conservation with radiation as opposed to mastectomy.  As a somewhat "full busted" woman symmetry issues are an issue with the prostheses or even with reconstruction.  With her  new onset diabetes, she would not have frank contraindication to breast reconstruction, although I do not think she is leaning in this direction at the time.   A family member in the distant past and had radiation therapy, over 30 years ago, and this may be having some impact on her decision tree regarding postoperative radiation.  She does live about a half an hour from the treatment facility so this could have an impact as well.  As she is the primary caregiver for her husband who suffered a traumatic brain injury 10 years ago this may also have an impact on her decision process. Classic data suggests reduction of in breast recurrence from 30% to 6% with whole breast radiation.  With a small lesion, intermediate grade hormone receptor positive its probably a 10-15% in breast recurrence with surgery and antiestrogen blockade alone.   The possibility of accelerated partial breast radiation which would decrease her treatment time was reviewed, with the caveat that it depends on clear anatomic guidelines. Informational brochure and websites were provided for information.   Opportunity for second surgical opinion was offered.   The patient was reassured with the imaging studies available and the histology available that this is a very favorable lesion, there is little likelihood of a benefit from adjuvant chemotherapy and now the  receptor status is available she would certainly benefit from estrogen blockade, especially considering her body mass index.   I told her it was very appropriate for her to take adequate time to make a clear decision as this lesion is not going to progress rapidly and it is better to have a well thought out plan then to act hastily.   She is scheduled to meet with Dr. Rogue Bussing from medical oncology later this week and his input will be appreciated.   The patient was encouraged to call if she had any questions regarding options or treatment scheduling.    This note is partially prepared by Ledell Noss, CMA acting as a scribe in the presence of Dr. Hervey Ard, MD.    The documentation recorded by the scribe accurately reflects the service I personally performed and the decisions made by me.    Robert Bellow, MD FACS

## 2021-05-16 NOTE — Assessment & Plan Note (Addendum)
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  # I had a long discussion with the patient in general regarding the treatment options of breast cancer including-surgery; adjuvant radiation; role of adjuvant systemic therapy including-chemotherapy antihormone therapy.    # Discussed surgical options-sentinel lymph node biopsy with lumpectomy versus mastectomy.  Discussed that lumpectomy is usually followed by r adjuvant radiation.  Based off clinical trials-[usually patients age> 70] the risk of recurrence from post radiation for low risk ER/PR positive breast cancer-1% versus around 10% with radiation.   #However patient absolutely declines radiation [based on her sister-in-law's rough experience with radiation].  She is leaning towards mastectomy.  #Discussed that given patient's presurgical clinical characteristics less likely that she will need chemotherapy.  However final decision regarding chemotherapy based on final surgical pathology/gene assay.   #I discussed the role of endocrine therapy-given ER/PR positive disease postsurgery. I discussed the potential benefits of each option; and also potential downsides in detail.  Thank you Dr.Byrnett for allowing me to participate in the care of your pleasant patient. Please do not hesitate to contact me with questions or concerns in the interim. Discussed with Dr.Byrnett  # DISPOSITION: # no labs-  # follow up in 1st week of JAN 2023- Dr.B

## 2021-05-16 NOTE — Progress Notes (Signed)
Patient here for initial vist.

## 2021-05-16 NOTE — Progress Notes (Signed)
[place] NOTE  Patient Care Team: Center, [facility] as PCP - General (General Practice) Rico Junker, RN as Registered Nurse Theodore Demark, RN as Registered Nurse Theodore Demark, RN as Oncology Nurse Navigator  CHIEF COMPLAINTS/PURPOSE OF CONSULTATION: Breast cancer  #  Oncology History Overview Note  DIAGNOSIS:  A. LEFT BREAST, 8:30; ULTRASOUND-GUIDED BIOPSY:  - INVASIVE MAMMARY CARCINOMA, NO SPECIAL TYPE.   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  female female with no prior history of breast cancer/or malignancies has been referred to [place] for further evaluation recommendations for new diagnosis of breast cancer.   Patient states she was found to have an abnormal screening mammogram which led to diagnostic mammogram/ultrasound/followed by biopsy-as summarized above.  Patient otherwise denies any headache.  Denies any new lumps or bumps.   Review of Systems  Constitutional:  Negative for chills, diaphoresis, fever, malaise/fatigue and weight loss.  HENT:  Negative for nosebleeds and sore throat.   Eyes:  Negative for double vision.  Respiratory:  Negative for cough, hemoptysis, sputum production, shortness of breath and wheezing.   Cardiovascular:  Negative for chest pain, palpitations, orthopnea and leg swelling.   Gastrointestinal:  Negative for abdominal pain, blood in stool, constipation, diarrhea, heartburn, melena, nausea and vomiting.  Genitourinary:  Negative for dysuria, frequency and urgency.  Musculoskeletal:  Positive for joint pain. Negative for back pain.  Skin: Negative.  Negative for itching and rash.  Neurological:  Negative for dizziness, tingling, focal weakness, weakness and headaches.  Endo/Heme/Allergies:  Does not bruise/bleed easily.  Psychiatric/Behavioral:  Negative for depression. The patient is not nervous/anxious and does not have insomnia.     MEDICAL HISTORY:  Past Medical History:  Diagnosis Date   Diabetes mellitus without complication ([place])    Epilepsy ([place])     SURGICAL HISTORY: Past Surgical History:  Procedure Laterality Date   ABDOMINAL HYSTERECTOMY     APPENDECTOMY     BREAST BIOPSY Right    benign 15 years ago   BREAST BIOPSY Left 05/05/2021   stereo bx-calcs/"RIBBON" clip-path pending   CHOLECYSTECTOMY      SOCIAL HISTORY: Social History   Socioeconomic History   Marital status: Married    Spouse name: Not on file   Number of children: Not on file   Years of education: Not on file   Highest education level: Not on file  Occupational History   Not on file  Tobacco Use  Smoking status: Former    Packs/day: 1.00    Years: 15.00    Pack years: 15.00    Types: Cigarettes   Smokeless tobacco: Never   Tobacco comments:    quit 20 years ago  Vaping Use   Vaping Use: Never used  Substance and Sexual Activity   Alcohol use: Not Currently   Drug use: Not Currently   Sexual activity: Not Currently  Other Topics Concern   Not on file  Social History Narrative   Not on file   Social Determinants of Health   Financial Resource Strain: Not on file  Food Insecurity: Not on file  Transportation Needs: Not on file  Physical Activity: Not on file  Stress: Not on file  Social Connections: Not on file  Intimate Partner Violence: Not on file     FAMILY HISTORY: Family History  Problem Relation Age of Onset   Breast cancer Maternal Aunt 15    ALLERGIES:  is allergic to vicodin [hydrocodone-acetaminophen].  MEDICATIONS:  Current Outpatient Medications  Medication Sig Dispense Refill   acetaminophen (TYLENOL) 500 MG tablet Take 1,000 mg by mouth every 6 (six) hours as needed for moderate pain.     albuterol (VENTOLIN HFA) 108 (90 Base) MCG/ACT inhaler Inhale 2 puffs into the lungs every 6 (six) hours as needed for wheezing or shortness of breath.     fluticasone (FLOVENT HFA) 220 MCG/ACT inhaler Inhale 2 puffs into the lungs 2 (two) times daily.     gabapentin (NEURONTIN) 300 MG capsule Take 300 mg by mouth at bedtime.     JARDIANCE 10 MG TABS tablet Take 10 mg by mouth daily.     loratadine (CLARITIN) 10 MG tablet Take 10 mg by mouth daily as needed for allergies.     sertraline (ZOLOFT) 100 MG tablet Take 150 mg by mouth at bedtime.     No current facility-administered medications for this visit.      Marland Kitchen  PHYSICAL EXAMINATION: ECOG PERFORMANCE STATUS: 0 - Asymptomatic  Vitals:   05/16/21 1333  BP: 127/63  Pulse: 81  Resp: 20  Temp: 99.7 F (37.6 C)  SpO2: 96%   Filed Weights   05/16/21 1333  Weight: (!) 300 lb 11.2 oz (136.4 kg)    Physical Exam Vitals and nursing note reviewed.  Constitutional:      Comments:     HENT:     Head: Normocephalic and atraumatic.     Mouth/Throat:     Mouth: Mucous membranes are moist.     Pharynx: No oropharyngeal exudate.  Eyes:     Pupils: Pupils are equal, round, and reactive to light.  Cardiovascular:     Rate and Rhythm: Normal rate and regular rhythm.  Pulmonary:     Effort: No respiratory distress.     Breath sounds: No wheezing.     Comments: Decreased breath sounds bilaterally at bases.  No wheeze or crackles Abdominal:     General: Bowel sounds are normal. There is no distension.     Palpations: Abdomen is soft. There is no mass.     Tenderness:  There is no abdominal tenderness. There is no guarding or rebound.  Musculoskeletal:        General: No tenderness. Normal range of motion.     Cervical back: Normal range of motion and neck supple.  Skin:    General: Skin is warm.  Neurological:     Mental Status: She is alert and oriented to person, place, and  time.  Psychiatric:        Mood and Affect: Affect normal.        Judgment: Judgment normal.     LABORATORY DATA:  I have reviewed the data as listed Lab Results  Component Value Date   WBC 6.9 06/21/2020   HGB 14.0 06/21/2020   HCT 43.6 06/21/2020   MCV 94.0 06/21/2020   PLT 162 06/21/2020   Recent Labs    06/21/20 2126  NA 140  K 3.7  CL 108  CO2 22  GLUCOSE 193*  BUN 11  CREATININE 0.67  CALCIUM 8.7*  GFRNONAA >60  PROT 6.8  ALBUMIN 3.7  AST 19  ALT 23  ALKPHOS 67  BILITOT 0.9    RADIOGRAPHIC STUDIES: I have personally reviewed the radiological images as listed and agreed with the findings in the report. US BREAST LTD UNI LEFT INC AXILLA  Result Date: 04/17/2021 CLINICAL DATA:  Screening recall for a possible left breast mass and left breast calcifications. EXAM: DIGITAL DIAGNOSTIC UNILATERAL LEFT MAMMOGRAM WITH TOMOSYNTHESIS AND CAD; ULTRASOUND LEFT BREAST LIMITED TECHNIQUE: Left digital diagnostic mammography and breast tomosynthesis was performed. The images were evaluated with computer-aided detection.; Targeted ultrasound examination of the left breast was performed. COMPARISON:  Previous exam(s). ACR Breast Density Category b: There are scattered areas of fibroglandular density. FINDINGS: The possible mass noted on the current screening study persists as a 7 mm irregular mass in the lower inner quadrant of the left breast. The mass lies in close approximation to the calcifications, specifically lying 3 cm lateral to the calcifications. The calcifications are predominantly rod-like, suggesting benign secretory calcifications, but several are punctate  and small and round. The calcifications span 2.9 x 2.0 x 2.9 cm. There is no associated mass or distortion. Calcifications are linearly arranged. On physical exam, no mass is palpated in the lower inner left breast. Targeted ultrasound is performed, showing a small hypoechoic mass in the left breast at 8 o'clock, 6 cm the nipple, measuring 7 x 5 x 6 mm. Mass shows irregular and some angular margins. Sonographic evaluation of the left axilla demonstrates normal lymph nodes. There are no enlarged or abnormal lymph nodes. IMPRESSION: 1. Suspicious 7 mm mass in the left breast at 8 o'clock, 6 cm from the nipple. Tissue sampling is recommended. 2. 2.9 cm span of linearly arranged calcifications, also in the lower inner quadrant of the left breast in close proximity to the suspicious 7 mm mass. Although many of these calcifications are consistent with benign secretory calcifications, their proximity to the above mass raises suspicion for DCIS and tissue sampling of the calcifications is also recommended. RECOMMENDATION: 1. Ultrasound-guided core needle biopsy of the left breast 7 mm mass at 8 o'clock. 2. Stereotactic guided core needle biopsy of the 2.9 cm span of calcifications in the lower inner left breast. I have discussed the findings and recommendations with the patient. If applicable, a reminder letter will be sent to the patient regarding the next appointment. BI-RADS CATEGORY  4: Suspicious. Electronically Signed   By: Lajean Manes M.D.   On: 04/17/2021 11:29  MS DIGITAL DIAG TOMO UNI LEFT  Result Date: 04/17/2021 CLINICAL DATA:  Screening recall for a possible left breast mass and left breast calcifications. EXAM: DIGITAL DIAGNOSTIC UNILATERAL LEFT MAMMOGRAM WITH TOMOSYNTHESIS AND CAD; ULTRASOUND LEFT BREAST LIMITED TECHNIQUE: Left digital diagnostic mammography and breast tomosynthesis was performed. The images were evaluated with computer-aided detection.; Targeted ultrasound examination of the left  breast was performed. COMPARISON:  Previous exam(s). ACR Breast Density Category b: There are scattered areas of fibroglandular density. FINDINGS: The possible mass noted on the current screening study persists as a 7 mm irregular mass in the lower inner quadrant of the left breast. The mass lies in close approximation to the calcifications, specifically lying 3 cm lateral to the calcifications. The calcifications are predominantly rod-like, suggesting benign secretory calcifications, but several are punctate and small and round. The calcifications span 2.9 x 2.0 x 2.9 cm. There is no associated mass or distortion. Calcifications are linearly arranged. On physical exam, no mass is palpated in the lower inner left breast. Targeted ultrasound is performed, showing a small hypoechoic mass in the left breast at 8 o'clock, 6 cm the nipple, measuring 7 x 5 x 6 mm. Mass shows irregular and some angular margins. Sonographic evaluation of the left axilla demonstrates normal lymph nodes. There are no enlarged or abnormal lymph nodes. IMPRESSION: 1. Suspicious 7 mm mass in the left breast at 8 o'clock, 6 cm from the nipple. Tissue sampling is recommended. 2. 2.9 cm span of linearly arranged calcifications, also in the lower inner quadrant of the left breast in close proximity to the suspicious 7 mm mass. Although many of these calcifications are consistent with benign secretory calcifications, their proximity to the above mass raises suspicion for DCIS and tissue sampling of the calcifications is also recommended. RECOMMENDATION: 1. Ultrasound-guided core needle biopsy of the left breast 7 mm mass at 8 o'clock. 2. Stereotactic guided core needle biopsy of the 2.9 cm span of calcifications in the lower inner left breast. I have discussed the findings and recommendations with the patient. If applicable, a reminder letter will be sent to the patient regarding the next appointment. BI-RADS CATEGORY  4: Suspicious. Electronically  Signed   By: Lajean Manes M.D.   On: 04/17/2021 11:29  MS CLIP PLACEMENT LEFT  Result Date: 05/05/2021 CLINICAL DATA:  Status post ultrasound-guided biopsy of a LEFT breast mass and stereotactic biopsy of LEFT breast calcifications. EXAM: DIAGNOSTIC LEFT MAMMOGRAM POST ULTRASOUND AND STEREOTACTIC BIOPSY COMPARISON:  Previous exam(s). FINDINGS: Mammographic images were obtained following ultrasound guided biopsy of a LEFT breast mass at the 8:30 o'clock axis and stereotactic guided biopsy of indeterminate calcifications in the lower inner quadrant of the LEFT breast. The heart shaped clip is well-positioned at the expected site of biopsy. The ribbon shaped clip is displaced laterally by approximately 1.5 cm. IMPRESSION: 1. Appropriate positioning of the heart shaped biopsy marking clip at the site of biopsy in the lower inner quadrant of the LEFT breast, corresponding to the targeted mass at the 8:30 o'clock axis. 2. Ribbon shaped biopsy marking clip is displaced 1.5 cm lateral to the site of biopsy in the lower inner quadrant of the LEFT breast, corresponding to the targeted calcifications. Final Assessment: Post Procedure Mammograms for Marker Placement Electronically Signed   By: Franki Cabot M.D.   On: 05/05/2021 09:22  MM LT BREAST BX W LOC DEV 1ST LESION IMAGE BX SPEC STEREO GUIDE  Addendum Date: 05/06/2021   ADDENDUM REPORT: 05/06/2021 12:12 ADDENDUM: PATHOLOGY revealed: Site A. LEFT BREAST, 8:30; ULTRASOUND-GUIDED BIOPSY: - INVASIVE MAMMARY CARCINOMA, NO SPECIAL TYPE. Size of invasive carcinoma: 4 mm in this sample. Grade 2. Ductal carcinoma in situ: Not identified. Lymphovascular invasion: Not identified. Pathology results are CONCORDANT with imaging findings, per Dr. Franki Cabot. PATHOLOGY revealed: Site B. LEFT BREAST, LOWER INNER QUADRANT CALCIFICATIONS; STEREOTACTIC BIOPSY: - FIBROCYSTIC CHANGES WITH ASSOCIATED COARSE CALCIFICATIONS. - NEGATIVE FOR  ATYPIA AND MALIGNANCY. Pathology results  are CONCORDANT with imaging findings, per Dr. Franki Cabot. Pathology results and recommendations below were discussed with patient by telephone on 05/06/2021. Patient reported biopsy site within normal limits with slight tenderness at the site. Post biopsy care instructions were reviewed, questions were answered and my direct phone number was provided to patient. Patient was instructed to call [facility] if any concerns or questions arise related to the biopsy. RECOMMENDATIONS: Surgical consultation for site A only. Request for surgical consultation relayed to Al Pimple RN at [facility] by Electa Sniff RN on 05/06/2021. Pathology results reported by Electa Sniff RN on 05/06/2021. Electronically Signed   By: Franki Cabot M.D.   On: 05/06/2021 12:12   Result Date: 05/06/2021 CLINICAL DATA:  Patient with indeterminate LEFT breast calcifications presents today for stereotactic biopsy. EXAM: LEFT BREAST STEREOTACTIC CORE NEEDLE BIOPSY COMPARISON:  Previous exams. FINDINGS: The patient and I discussed the procedure of stereotactic-guided biopsy including benefits and alternatives. We discussed the high likelihood of a successful procedure. We discussed the risks of the procedure including infection, bleeding, tissue injury, clip migration, and inadequate sampling. Informed written consent was given. The usual time out protocol was performed immediately prior to the procedure. Using sterile technique and 1% Lidocaine as local anesthetic, under stereotactic guidance, a 9 gauge vacuum assisted device was used to perform core needle biopsy of calcifications in the lower inner quadrant of the left breast using a medial approach. Specimen radiograph was performed showing calcifications. Specimens with calcifications are identified for pathology. Lesion quadrant: Lower inner quadrant At the conclusion of the procedure, ribbon shaped tissue marker clip was deployed into the biopsy cavity.  Follow-up 2-view mammogram was performed and dictated separately. IMPRESSION: Stereotactic-guided biopsy of indeterminate calcifications within the lower inner quadrant of the LEFT breast. No apparent complications. Electronically Signed: By: Franki Cabot M.D. On: 05/05/2021 09:11   [place] LT BREAST BX W LOC DEV 1ST LESION IMG BX SPEC US GUIDE  Addendum Date: 05/06/2021   ADDENDUM REPORT: 05/06/2021 12:12 ADDENDUM: PATHOLOGY revealed: Site A. LEFT BREAST, 8:30; ULTRASOUND-GUIDED BIOPSY: - INVASIVE MAMMARY CARCINOMA, NO SPECIAL TYPE. Size of invasive carcinoma: 4 mm in this sample. Grade 2. Ductal carcinoma in situ: Not identified. Lymphovascular invasion: Not identified. Pathology results are CONCORDANT with imaging findings, per Dr. Franki Cabot. PATHOLOGY revealed: Site B. LEFT BREAST, LOWER INNER QUADRANT CALCIFICATIONS; STEREOTACTIC BIOPSY: - FIBROCYSTIC CHANGES WITH ASSOCIATED COARSE CALCIFICATIONS. - NEGATIVE FOR ATYPIA AND MALIGNANCY. Pathology results are CONCORDANT with imaging findings, per Dr. Franki Cabot. Pathology results and recommendations below were discussed with patient by telephone on 05/06/2021. Patient reported biopsy site within normal limits with slight tenderness at the site. Post biopsy care instructions were reviewed, questions were answered and my direct phone number was provided to patient. Patient was instructed to call [facility] if any concerns or questions arise related to the biopsy. RECOMMENDATIONS: Surgical consultation. Request for surgical consultation relayed to Al Pimple RN at [facility] by Electa Sniff RN on 05/06/2021. Pathology results reported by Electa Sniff RN on 05/06/2021. Electronically Signed   By: Franki Cabot M.D.   On: 05/06/2021 12:12   Result Date: 05/06/2021 CLINICAL DATA:  Patient with a LEFT breast mass at the 8:30 o'clock axis presents today for ultrasound-guided core biopsy. Patient with additional indeterminate  calcifications within the lower inner quadrant of the LEFT breast for which stereotactic biopsy will be performed later same day with procedure dictated on  separate report. EXAM: ULTRASOUND GUIDED LEFT BREAST CORE NEEDLE BIOPSY COMPARISON:  Previous exam(s). PROCEDURE: I met with the patient and we discussed the procedure of ultrasound-guided biopsy, including benefits and alternatives. We discussed the high likelihood of a successful procedure. We discussed the risks of the procedure, including infection, bleeding, tissue injury, clip migration, and inadequate sampling. Informed written consent was given. The usual time-out protocol was performed immediately prior to the procedure. Lesion quadrant: Lower inner quadrant Using sterile technique and 1% Lidocaine as local anesthetic, under direct ultrasound visualization, a 12 gauge spring-loaded device was used to perform biopsy of the LEFT breast mass at the 8:30 o'clock axis using a lateral approach. At the conclusion of the procedure heart shaped tissue marker clip was deployed into the biopsy cavity. Follow up 2 view mammogram was performed and dictated separately. IMPRESSION: Ultrasound guided biopsy of the LEFT breast mass at the 8:30 o'clock axis. No apparent complications. Electronically Signed: By: Franki Cabot M.D. 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  # I had a long discussion with the patient in general regarding the treatment options of breast cancer including-surgery; adjuvant radiation; role of adjuvant systemic therapy including-chemotherapy antihormone therapy.    # Discussed surgical options-sentinel lymph node biopsy with lumpectomy versus mastectomy.  Discussed that lumpectomy is usually followed by r adjuvant radiation.  Based off clinical trials-[usually patients  age> 70] the risk of recurrence from post radiation for low risk ER/PR positive breast cancer-1% versus around 10% with radiation.   #However patient absolutely declines radiation [based on her sister-in-law's rough experience with radiation].  She is leaning towards mastectomy.  #Discussed that given patient's presurgical clinical characteristics less likely that she will need chemotherapy.  However final decision regarding chemotherapy based on final surgical pathology/gene assay.   #I discussed the role of endocrine therapy-given ER/PR positive disease postsurgery. I discussed the potential benefits of each option; and also potential downsides in detail.  Thank you Dr.Byrnett for allowing me to participate in the care of your pleasant patient. Please do not hesitate to contact me with questions or concerns in the interim. Discussed with Dr.Byrnett  # DISPOSITION: # no labs-  # follow up in 1st week of JAN 2023- Dr.B  All questions were answered. The patient/family knows to call the clinic with any problems, questions or concerns.    Cammie Sickle, MD 05/16/2021 2:29 PM

## 2021-05-21 ENCOUNTER — Other Ambulatory Visit: Payer: Self-pay

## 2021-05-21 ENCOUNTER — Encounter
Admission: RE | Admit: 2021-05-21 | Discharge: 2021-05-21 | Disposition: A | Discharge status: Home / Self Care | Payer: Medicaid Other | Source: Ambulatory Visit | Attending: General Surgery | Admitting: General Surgery

## 2021-05-21 DIAGNOSIS — E119 Type 2 diabetes mellitus without complications: Secondary | ICD-10-CM | POA: Diagnosis present

## 2021-05-21 DIAGNOSIS — J984 Other disorders of lung: Secondary | ICD-10-CM

## 2021-05-21 HISTORY — DX: Unspecified osteoarthritis, unspecified site: M19.90

## 2021-05-21 HISTORY — DX: Pneumonia, unspecified organism: J18.9

## 2021-05-21 HISTORY — DX: Chronic obstructive pulmonary disease, unspecified: J44.9

## 2021-05-21 HISTORY — DX: Dyspnea, unspecified: R06.00

## 2021-05-21 HISTORY — DX: Sleep apnea, unspecified: G47.30

## 2021-05-21 HISTORY — DX: Malignant (primary) neoplasm, unspecified: C80.1

## 2021-05-21 LAB — HEMOGLOBIN A1C
Hgb A1c MFr Bld: 8.4 % — ABNORMAL HIGH (ref 4.8–5.6)
Mean Plasma Glucose: 194 mg/dL

## 2021-05-21 LAB — CBC
HCT: 43 % (ref 36.0–46.0)
Hemoglobin: 14.2 g/dL (ref 12.0–15.0)
MCH: 30.9 pg (ref 26.0–34.0)
MCHC: 33 g/dL (ref 30.0–36.0)
MCV: 93.7 fL (ref 80.0–100.0)
Platelets: 227 10*3/uL (ref 150–400)
RBC: 4.59 MIL/uL (ref 3.87–5.11)
RDW: 12.6 % (ref 11.5–15.5)
WBC: 6.1 10*3/uL (ref 4.0–10.5)
nRBC: 0 % (ref 0.0–0.2)

## 2021-05-21 LAB — BASIC METABOLIC PANEL
Anion gap: 7 (ref 5–15)
BUN: 20 mg/dL (ref 6–20)
CO2: 25 mmol/L (ref 22–32)
Calcium: 8.9 mg/dL (ref 8.9–10.3)
Chloride: 107 mmol/L (ref 98–111)
Creatinine, Ser: 0.59 mg/dL (ref 0.44–1.00)
GFR, Estimated: >60 mL/min (ref >60)
Glucose, Bld: 158 mg/dL — ABNORMAL HIGH (ref 70–99)
Potassium: 3.8 mmol/L (ref 3.5–5.1)
Sodium: 139 mmol/L (ref 135–145)

## 2021-05-21 NOTE — Patient Instructions (Signed)
Your procedure is scheduled on:05-26-21 Monday Report to the Registration Desk on the 1st floor of the [facility].Then proceed to the Radiology Desk (2nd desk on right) in [place]. Arrive @ 8:30 am  REMEMBER: Instructions that are not followed completely may result in serious medical risk, up to and including death; or upon the discretion of your surgeon and anesthesiologist your surgery may need to be rescheduled.  Do not eat food after midnight the night before surgery.  No gum chewing, lozengers or hard candies.  You may however, drink Water up to 2 hours before you are scheduled to arrive for your surgery. Do not drink anything within 2 hours of your scheduled arrival time  Type 1 and Type 2 diabetics should only drink water.  Do not take any medication the day of surgery  Use your fluticasone (FLOVENT HFA) 220 MCG/ACT inhaler and your albuterol (VENTOLIN HFA) 108 (90 Base) MCG/ACT inhaler the day of surgery and bring only your Albuterol Inhaler to the hospital  Stop your JARDIANCE 10 MG TABS tablet 3 days prior to surgery-Last dose on 05-22-21 Thursday  One week prior to surgery: Stop Anti-inflammatories (NSAIDS) such as Advil, Aleve, Ibuprofen, Motrin, Naproxen, Naprosyn and Aspirin based products such as Excedrin, Goodys Powder, BC Powder.You may however, continue to take Tylenol if needed for pain up until the day of surgery.  Stop ANY OVER THE COUNTER supplements/vitamins NOW (05-21-21) until after surgery.  No Alcohol for 24 hours before or after surgery.  No Smoking including e-cigarettes for 24 hours prior to surgery.  No chewable tobacco products for at least 6 hours prior to surgery.  No nicotine patches on the day of surgery.  Do not use any "recreational" drugs for at least a week prior to your surgery.  Please be advised that the combination of cocaine and anesthesia may have negative outcomes, up to and including death. If you test positive for cocaine, your  surgery will be cancelled.  On the morning of surgery brush your teeth with toothpaste and water, you may rinse your mouth with mouthwash if you wish. Do not swallow any toothpaste or mouthwash.  Use CHG Soap as directed on instruction sheet.  Do not wear jewelry, make-up, hairpins, clips or nail polish.  Do not wear lotions, powders, or perfumes.   Do not shave body from the neck down 48 hours prior to surgery just in case you cut yourself which could leave a site for infection.  Also, freshly shaved skin may become irritated if using the CHG soap.  Contact lenses, hearing aids and dentures may not be worn into surgery.  Do not bring valuables to the hospital. [facility] is not responsible for any missing/lost belongings or valuables.   Notify your doctor if there is any change in your medical condition (cold, fever, infection).  Wear comfortable clothing (specific to your surgery type) to the hospital.  After surgery, you can help prevent lung complications by doing breathing exercises.  Take deep breaths and cough every 1-2 hours. Your doctor may order a device called an Incentive Spirometer to help you take deep breaths. When coughing or sneezing, hold a pillow firmly against your incision with both hands. This is called "splinting." Doing this helps protect your incision. It also decreases belly discomfort.  If you are being admitted to the hospital overnight, leave your suitcase in the car. After surgery it may be brought to your room.  If you are being discharged the day of surgery, you  will not be allowed to drive home. You will need a responsible adult (18 years or older) to drive you home and stay with you that night.   If you are taking public transportation, you will need to have a responsible adult (18 years or older) with you. Please confirm with your physician that it is acceptable to use public transportation.   Please call the [facility] Dept. at [phone] if you have any questions about these instructions.  Surgery Visitation Policy:  Patients undergoing a surgery or procedure may have one family member or support person with them as long as that person is not COVID-19 positive or experiencing its symptoms.  That person may remain in the waiting area during the procedure and may rotate out with other people.  Inpatient Visitation:    Visiting hours are 7 a.m. to 8 p.m. Up to two visitors ages 16+ are allowed at one time in a patient room. The visitors may rotate out with other people during the day. Visitors must check out when they leave, or other visitors will not be allowed. One designated support person may remain overnight. The visitor must pass COVID-19 screenings, use hand sanitizer when entering and exiting the patient's room and wear a mask at all times, including in the patient's room. Patients must also wear a mask when staff or their visitor are in the room. Masking is required regardless of vaccination status.

## 2021-05-25 MED ORDER — CEFAZOLIN SODIUM-DEXTROSE 2-4 GM/100ML-% IV SOLN
2.0000 g | INTRAVENOUS | Status: AC
  Administered 2021-05-26: 2 g via INTRAVENOUS

## 2021-05-25 MED ORDER — CHLORHEXIDINE GLUCONATE CLOTH 2 % EX PADS: 6.0000 | MEDICATED_PAD | Freq: Once | CUTANEOUS | Status: DC

## 2021-05-25 MED ORDER — ORAL CARE MOUTH RINSE: 15.0000 mL | Freq: Once | OROMUCOSAL | Status: AC

## 2021-05-25 MED ORDER — SODIUM CHLORIDE 0.9 % IV SOLN
INTRAVENOUS | Status: DC
  Administered 2021-05-26: 50 mL/h via INTRAVENOUS

## 2021-05-25 MED ORDER — FAMOTIDINE 20 MG PO TABS: 20.0000 mg | ORAL_TABLET | Freq: Once | ORAL | Status: AC

## 2021-05-25 MED ORDER — CHLORHEXIDINE GLUCONATE 0.12 % MT SOLN: 15.0000 mL | Freq: Once | OROMUCOSAL | Status: AC

## 2021-05-26 ENCOUNTER — Other Ambulatory Visit: Payer: Self-pay

## 2021-05-26 ENCOUNTER — Encounter
Admission: RE | Disposition: A | Discharge status: Home / Self Care | Payer: Self-pay | Source: Ambulatory Visit | Attending: General Surgery

## 2021-05-26 ENCOUNTER — Ambulatory Visit: Payer: Medicaid Other | Admitting: Certified Registered Nurse Anesthetist

## 2021-05-26 ENCOUNTER — Encounter
Admission: RE | Admit: 2021-05-26 | Discharge: 2021-05-26 | Disposition: A | Discharge status: Home / Self Care | Payer: Medicaid Other | Source: Ambulatory Visit | Attending: General Surgery | Admitting: General Surgery

## 2021-05-26 ENCOUNTER — Encounter: Payer: Self-pay | Admitting: General Surgery

## 2021-05-26 ENCOUNTER — Ambulatory Visit
Admission: RE | Admit: 2021-05-26 | Discharge: 2021-05-26 | Disposition: A | Discharge status: Home / Self Care | Payer: Medicaid Other | Source: Ambulatory Visit | Attending: General Surgery | Admitting: General Surgery

## 2021-05-26 ENCOUNTER — Ambulatory Visit: Payer: Medicaid Other | Admitting: Urgent Care

## 2021-05-26 DIAGNOSIS — Z87891 Personal history of nicotine dependence: Secondary | ICD-10-CM | POA: Diagnosis not present

## 2021-05-26 DIAGNOSIS — E119 Type 2 diabetes mellitus without complications: Secondary | ICD-10-CM | POA: Insufficient documentation

## 2021-05-26 DIAGNOSIS — J449 Chronic obstructive pulmonary disease, unspecified: Secondary | ICD-10-CM | POA: Insufficient documentation

## 2021-05-26 DIAGNOSIS — G4730 Sleep apnea, unspecified: Secondary | ICD-10-CM | POA: Insufficient documentation

## 2021-05-26 DIAGNOSIS — R569 Unspecified convulsions: Secondary | ICD-10-CM | POA: Insufficient documentation

## 2021-05-26 DIAGNOSIS — C50912 Malignant neoplasm of unspecified site of left female breast: Secondary | ICD-10-CM | POA: Insufficient documentation

## 2021-05-26 HISTORY — PX: SIMPLE MASTECTOMY WITH AXILLARY SENTINEL NODE BIOPSY: SHX6098

## 2021-05-26 LAB — GLUCOSE, CAPILLARY
Glucose-Capillary: 225 mg/dL — ABNORMAL HIGH (ref 70–99)
Glucose-Capillary: 267 mg/dL — ABNORMAL HIGH (ref 70–99)

## 2021-05-26 SURGERY — SIMPLE MASTECTOMY WITH AXILLARY SENTINEL NODE BIOPSY
Anesthesia: General | Site: Breast | Laterality: Left

## 2021-05-26 MED ORDER — KETOROLAC TROMETHAMINE 30 MG/ML IJ SOLN
INTRAMUSCULAR | Status: AC
  Filled 2021-05-26: qty 1

## 2021-05-26 MED ORDER — ONDANSETRON HCL 4 MG/2ML IJ SOLN
INTRAMUSCULAR | Status: DC | PRN
  Administered 2021-05-26: 4 mg via INTRAVENOUS

## 2021-05-26 MED ORDER — FAMOTIDINE 20 MG PO TABS
ORAL_TABLET | ORAL | Status: AC
  Administered 2021-05-26: 20 mg via ORAL
  Filled 2021-05-26: qty 1

## 2021-05-26 MED ORDER — ACETAMINOPHEN 10 MG/ML IV SOLN
INTRAVENOUS | Status: DC | PRN
  Administered 2021-05-26: 1000 mg via INTRAVENOUS

## 2021-05-26 MED ORDER — MIDAZOLAM HCL 2 MG/2ML IJ SOLN
INTRAMUSCULAR | Status: DC | PRN
Start: 2021-05-26 — End: 2021-05-26
  Administered 2021-05-26: 2 mg via INTRAVENOUS

## 2021-05-26 MED ORDER — FENTANYL CITRATE (PF) 100 MCG/2ML IJ SOLN
INTRAMUSCULAR | Status: AC
  Filled 2021-05-26: qty 2

## 2021-05-26 MED ORDER — TECHNETIUM TC 99M TILMANOCEPT KIT
1.0000 | PACK | Freq: Once | INTRAVENOUS | Status: AC | PRN
  Administered 2021-05-26: 1.1 via INTRADERMAL

## 2021-05-26 MED ORDER — FENTANYL CITRATE (PF) 100 MCG/2ML IJ SOLN: 25.0000 ug | INTRAMUSCULAR | Status: DC | PRN

## 2021-05-26 MED ORDER — CHLORHEXIDINE GLUCONATE 0.12 % MT SOLN
OROMUCOSAL | Status: AC
  Administered 2021-05-26: 15 mL via OROMUCOSAL
  Filled 2021-05-26: qty 15

## 2021-05-26 MED ORDER — KETOROLAC TROMETHAMINE 30 MG/ML IJ SOLN
INTRAMUSCULAR | Status: DC | PRN
Start: 2021-05-26 — End: 2021-05-26
  Administered 2021-05-26: 30 mg via INTRAVENOUS

## 2021-05-26 MED ORDER — ACETAMINOPHEN 10 MG/ML IV SOLN
INTRAVENOUS | Status: AC
  Filled 2021-05-26: qty 100

## 2021-05-26 MED ORDER — DEXAMETHASONE SODIUM PHOSPHATE 10 MG/ML IJ SOLN
INTRAMUSCULAR | Status: DC | PRN
  Administered 2021-05-26: 10 mg via INTRAVENOUS

## 2021-05-26 MED ORDER — OXYCODONE HCL 5 MG PO TABS: 5.0000 mg | ORAL_TABLET | Freq: Three times a day (TID) | ORAL | 0 refills | Status: DC | PRN

## 2021-05-26 MED ORDER — PROPOFOL 10 MG/ML IV BOLUS
INTRAVENOUS | Status: AC
  Filled 2021-05-26: qty 20

## 2021-05-26 MED ORDER — DEXMEDETOMIDINE (PRECEDEX) IN NS 20 MCG/5ML (4 MCG/ML) IV SYRINGE
PREFILLED_SYRINGE | INTRAVENOUS | Status: DC | PRN
  Administered 2021-05-26 (×5): 4 ug via INTRAVENOUS

## 2021-05-26 MED ORDER — PROPOFOL 10 MG/ML IV BOLUS
INTRAVENOUS | Status: DC | PRN
  Administered 2021-05-26: 130 mg via INTRAVENOUS

## 2021-05-26 MED ORDER — STERILE WATER FOR IRRIGATION IR SOLN
Status: DC | PRN
  Administered 2021-05-26: 500 mL

## 2021-05-26 MED ORDER — PROPOFOL 1000 MG/100ML IV EMUL
INTRAVENOUS | Status: AC
  Filled 2021-05-26: qty 100

## 2021-05-26 MED ORDER — FENTANYL CITRATE (PF) 100 MCG/2ML IJ SOLN
INTRAMUSCULAR | Status: DC | PRN
  Administered 2021-05-26 (×4): 50 ug via INTRAVENOUS

## 2021-05-26 MED ORDER — MIDAZOLAM HCL 2 MG/2ML IJ SOLN
INTRAMUSCULAR | Status: AC
  Filled 2021-05-26: qty 2

## 2021-05-26 MED ORDER — CEFAZOLIN SODIUM-DEXTROSE 2-4 GM/100ML-% IV SOLN
INTRAVENOUS | Status: AC
  Filled 2021-05-26: qty 100

## 2021-05-26 MED ORDER — METHYLENE BLUE 0.5 % INJ SOLN
INTRAVENOUS | Status: AC
  Filled 2021-05-26: qty 10

## 2021-05-26 MED ORDER — LIDOCAINE HCL (CARDIAC) PF 100 MG/5ML IV SOSY
PREFILLED_SYRINGE | INTRAVENOUS | Status: DC | PRN
  Administered 2021-05-26: 100 mg via INTRAVENOUS

## 2021-05-26 MED ORDER — ROCURONIUM BROMIDE 100 MG/10ML IV SOLN
INTRAVENOUS | Status: DC | PRN
  Administered 2021-05-26: 10 mg via INTRAVENOUS
  Administered 2021-05-26 (×3): 20 mg via INTRAVENOUS
  Administered 2021-05-26: 50 mg via INTRAVENOUS
  Administered 2021-05-26: 20 mg via INTRAVENOUS

## 2021-05-26 MED ORDER — ROCURONIUM BROMIDE 10 MG/ML (PF) SYRINGE
PREFILLED_SYRINGE | INTRAVENOUS | Status: AC
  Filled 2021-05-26: qty 10

## 2021-05-26 MED ORDER — BUPIVACAINE HCL (PF) 0.5 % IJ SOLN
INTRAMUSCULAR | Status: AC
  Filled 2021-05-26: qty 30

## 2021-05-26 MED ORDER — SUGAMMADEX SODIUM 500 MG/5ML IV SOLN
INTRAVENOUS | Status: DC | PRN
  Administered 2021-05-26: 500 mg via INTRAVENOUS

## 2021-05-26 MED ORDER — METHYLENE BLUE 0.5 % INJ SOLN
INTRAVENOUS | Status: DC | PRN
  Administered 2021-05-26: 5 mL via SUBMUCOSAL

## 2021-05-26 SURGICAL SUPPLY — 58 items
APL PRP STRL LF DISP 70% ISPRP (MISCELLANEOUS) ×1
APPLIER CLIP 11 MED OPEN (CLIP)
APPLIER CLIP 13 LRG OPEN (CLIP)
APR CLP LRG 13 20 CLIP (CLIP)
APR CLP MED 11 20 MLT OPN (CLIP)
BINDER BREAST XXLRG (GAUZE/BANDAGES/DRESSINGS) ×1 IMPLANT
BLADE PHOTON ILLUMINATED (MISCELLANEOUS) ×1 IMPLANT
BLADE SURG 15 STRL SS SAFETY (BLADE) ×3 IMPLANT
BULB RESERV EVAC DRAIN JP 100C (MISCELLANEOUS) ×1 IMPLANT
CHLORAPREP W/TINT 26 (MISCELLANEOUS) ×3 IMPLANT
CLIP APPLIE 11 MED OPEN (CLIP) IMPLANT
CLIP APPLIE 13 LRG OPEN (CLIP) IMPLANT
CNTNR SPEC 2.5X3XGRAD LEK (MISCELLANEOUS)
CONT SPEC 4OZ STER OR WHT (MISCELLANEOUS)
CONT SPEC 4OZ STRL OR WHT (MISCELLANEOUS)
CONTAINER SPEC 2.5X3XGRAD LEK (MISCELLANEOUS) ×6 IMPLANT
DRAIN CHANNEL JP 15F RND 16 (MISCELLANEOUS) ×1 IMPLANT
DRAPE LAPAROTOMY TRNSV 106X77 (MISCELLANEOUS) ×3 IMPLANT
DRSG GAUZE FLUFF 36X18 (GAUZE/BANDAGES/DRESSINGS) ×5 IMPLANT
DRSG TELFA 3X8 NADH (GAUZE/BANDAGES/DRESSINGS) ×2 IMPLANT
ELECT CAUTERY BLADE TIP 2.5 (TIP) ×2
ELECT REM PT RETURN 9FT ADLT (ELECTROSURGICAL) ×2
ELECTRODE CAUTERY BLDE TIP 2.5 (TIP) ×2 IMPLANT
ELECTRODE REM PT RTRN 9FT ADLT (ELECTROSURGICAL) ×2 IMPLANT
GAUZE 4X4 16PLY ~~LOC~~+RFID DBL (SPONGE) ×3 IMPLANT
GLOVE SURG ENC MOIS LTX SZ7.5 (GLOVE) ×3 IMPLANT
GLOVE SURG UNDER LTX SZ8 (GLOVE) ×3 IMPLANT
GOWN STRL REUS W/ TWL LRG LVL3 (GOWN DISPOSABLE) ×4 IMPLANT
GOWN STRL REUS W/TWL LRG LVL3 (GOWN DISPOSABLE) ×4
LABEL OR SOLS (LABEL) ×3 IMPLANT
MANIFOLD NEPTUNE II (INSTRUMENTS) ×3 IMPLANT
PACK BASIN MINOR ARMC (MISCELLANEOUS) ×3 IMPLANT
PAD DRESSING TELFA 3X8 NADH (GAUZE/BANDAGES/DRESSINGS) ×2 IMPLANT
PENCIL ELECTRO HAND CTR (MISCELLANEOUS) ×3 IMPLANT
PIN SAFETY STRL (MISCELLANEOUS) ×3 IMPLANT
RETRACTOR RING XSMALL (MISCELLANEOUS) ×2 IMPLANT
RTRCTR WOUND ALEXIS 13CM XS SH (MISCELLANEOUS)
SHEARS FOC LG CVD HARMONIC 17C (MISCELLANEOUS) IMPLANT
SLEVE PROBE SENORX GAMMA FIND (MISCELLANEOUS) ×3 IMPLANT
SPONGE T-LAP 18X18 ~~LOC~~+RFID (SPONGE) ×4 IMPLANT
STAPLER SKIN PROX 35W (STAPLE) ×1 IMPLANT
STRIP CLOSURE SKIN 1/2X4 (GAUZE/BANDAGES/DRESSINGS) ×5 IMPLANT
SUT ETHILON 3-0 FS-10 30 BLK (SUTURE) ×2
SUT SILK 2 0 (SUTURE) ×2
SUT SILK 2-0 30XBRD TIE 12 (SUTURE) ×2 IMPLANT
SUT SILK 3 0 (SUTURE) ×2
SUT SILK 3-0 18XBRD TIE 12 (SUTURE) ×2 IMPLANT
SUT VIC AB 2-0 CT1 27 (SUTURE) ×8
SUT VIC AB 2-0 CT1 TAPERPNT 27 (SUTURE) ×8 IMPLANT
SUT VIC AB 3-0 54X BRD REEL (SUTURE) IMPLANT
SUT VIC AB 3-0 BRD 54 (SUTURE) ×6
SUT VIC AB 3-0 SH 27 (SUTURE) ×2
SUT VIC AB 3-0 SH 27X BRD (SUTURE) ×2 IMPLANT
SUT VICRYL+ 3-0 144IN (SUTURE) ×3 IMPLANT
SUTURE EHLN 3-0 FS-10 30 BLK (SUTURE) ×2 IMPLANT
SWABSTK COMLB BENZOIN TINCTURE (MISCELLANEOUS) ×3 IMPLANT
TAPE TRANSPORE STRL 2 31045 (GAUZE/BANDAGES/DRESSINGS) ×3 IMPLANT
WATER STERILE IRR 500ML POUR (IV SOLUTION) ×3 IMPLANT

## 2021-05-26 NOTE — Op Note (Signed)
Preoperative diagnosis: Left breast cancer.  Postoperative diagnosis: Same.  Operative procedure: Left simple mastectomy with sentinel node biopsy, tissue transfer for skin closure.  Operating Surgeon: Hervey Ard, MD.  Anesthesia: General endotracheal.  Estimated blood loss 100 cc.  Medical note: This 60 year old woman had a recent identified small breast cancer.  Long review of options including breast conservation and mastectomy and the patient desired mastectomy.  This was confirmed on the second visit.  Patient underwent injection with technetium sulfur colloid the morning of the procedure.  She is brought to the operative for planned left simple mastectomy and sentinel node biopsy.  She received Ancef prior to the procedure.  SCD stockings for DVT prevention.  Operative note: With the patient under adequate general endotracheal anesthesia the area of the periareolar skin was cleaned with alcohol and 5 cc of 0.5% methylene blue was utilized and well-tolerated.  An elliptical incision was outlined on the breast.  Skin was incised sharply and remaining dissection completed with the PlasmaBlade.  Flaps were elevated superiorly to the clavicle, laterally to the serratus fascia, inferiorly to the inferior margin of the pectoralis muscle and medially to the sternum.  Perforating vessels were controlled with 3-0 Vicryl ties.  Attempts were made to maintain 5-6 mm thick skin flaps.  Once the breast was rolled off the underlying muscle taking the fascia with the specimen the axillary envelope was scanned with the node seeker.  There was 1 large node that was cold, this was removed and sent as a nonsentinel node.  The 3 additional nodes were identified with node seeker, none with blue dye.  These were all sent together for sentinel node exam in formalin.  The wound was irrigated with sterile water.  A 15 [place] Blake drain was brought out through the inferior medial aspect of the flap.  This was  anchored in place with a 3-0 nylon.  There was a significant amount of redundant skin laterally.  A "Y-V" plasty was completed on the skin to provide smoother apposition.  The flaps were closed with a running 2-0 Vicryl suture in the deep dermal layer.  Skin staples were utilized.  The drain was placed to self suction.  Telfa, fluff gauze and a compressive wrap were applied.  Patient tolerated procedure well and was taken to the PACU in stable condition.

## 2021-05-26 NOTE — H&P (Signed)
Cheryl Silva [id number] 06/15/1961     HPI: Healthy 60 y./o woman with recently diagnosed left breast cancer.  Desires mastectomy.    Medications Prior to Admission  Medication Sig Dispense Refill Last Dose   acetaminophen (TYLENOL) 500 MG tablet Take 1,000 mg by mouth every 6 (six) hours as needed for moderate pain.   Past Month   albuterol (VENTOLIN HFA) 108 (90 Base) MCG/ACT inhaler Inhale 2 puffs into the lungs every 6 (six) hours as needed for wheezing or shortness of breath.   05/25/2021 at 0800   fluticasone (FLOVENT HFA) 220 MCG/ACT inhaler Inhale 2 puffs into the lungs 2 (two) times daily.   05/25/2021 at 0800   gabapentin (NEURONTIN) 300 MG capsule Take 300 mg by mouth at bedtime.   05/24/2021   JARDIANCE 10 MG TABS tablet Take 10 mg by mouth daily.   05/22/2021   loratadine (CLARITIN) 10 MG tablet Take 10 mg by mouth daily as needed for allergies.   Past Month   sertraline (ZOLOFT) 100 MG tablet Take 150 mg by mouth at bedtime.   05/22/2021   Allergies  Allergen Reactions   Vicodin [Hydrocodone-Acetaminophen] Itching   Past Medical History:  Diagnosis Date   Arthritis    Cancer ([place])    left breast   COPD (chronic obstructive pulmonary disease) (HCC)    Diabetes mellitus without complication ([place])    Dyspnea    Epilepsy ([place])    no siezures in 43 years   Pneumonia    possibly beginning of November 2022-pt states md did not do cxr but was treating it as pneumonia   Sleep apnea    could not tolerate cpap   Past Surgical History:  Procedure Laterality Date   ABDOMINAL HYSTERECTOMY     APPENDECTOMY     BREAST BIOPSY Right    benign 15 years ago   BREAST BIOPSY Left 05/05/2021   stereo bx-calcs/"RIBBON" clip-path pending   CHOLECYSTECTOMY     COLONOSCOPY     JOINT REPLACEMENT Bilateral    2005 and 2018 knees   Social History   Socioeconomic History   Marital status: Married    Spouse name: Not on file   Number of children: Not on file   Years of education:  Not on file   Highest education level: Not on file  Occupational History   Not on file  Tobacco Use   Smoking status: Former    Packs/day: 1.00    Years: 15.00    Pack years: 15.00    Types: Cigarettes    Quit date: 2002    Years since quitting: 20.9   Smokeless tobacco: Never   Tobacco comments:    quit 20 years ago  Vaping Use   Vaping Use: Never used  Substance and Sexual Activity   Alcohol use: Not Currently   Drug use: Not Currently   Sexual activity: Not Currently  Other Topics Concern   Not on file  Social History Narrative   Patient lives in home with husband. Children and husband will be there to help out.   Feels safe in her home.  Does not work outside of home.   Social Determinants of Health   Financial Resource Strain: Not on file  Food Insecurity: Not on file  Transportation Needs: Not on file  Physical Activity: Not on file  Stress: Not on file  Social Connections: Not on file  Intimate Partner Violence: Not on file   Social History   Social History  Narrative   Patient lives in home with husband. Children and husband will be there to help out.   Feels safe in her home.  Does not work outside of home.     ROS: Negative.     PE: HEENT: Negative. Lungs: Clear. Cardio: RR.   Assessment/Plan:  Proceed with planned endoscopy.   Forest Gleason [facility] 05/26/2021

## 2021-05-26 NOTE — Transfer of Care (Signed)
Immediate Anesthesia Transfer of Care Note  Patient: Cheryl Silva  Procedure(s) Performed: SIMPLE MASTECTOMY WITH AXILLARY SENTINEL NODE BIOPSY (Left: Breast)  Patient Location: PACU  Anesthesia Type:General  Level of Consciousness: awake, alert  and oriented  Airway & Oxygen Therapy: Patient Spontanous Breathing and Patient connected to face mask oxygen  Post-op Assessment: Report given to RN and Post -op Vital signs reviewed and stable  Post vital signs: Reviewed and stable  Last Vitals:  Vitals Value Taken Time  BP 112/43 05/26/21 1300  Temp    Pulse 73 05/26/21 1300  Resp 20 05/26/21 1300  SpO2 99 % 05/26/21 1300  Vitals shown include unvalidated device data.  Last Pain:  Vitals:   05/26/21 0908  TempSrc: Tympanic  PainSc: 0-No pain      Patients Stated Pain Goal: 0 (25/48/62 8241)  Complications: No notable events documented.

## 2021-05-26 NOTE — Anesthesia Postprocedure Evaluation (Signed)
Anesthesia Post Note  Patient: Cheryl Silva  Procedure(s) Performed: SIMPLE MASTECTOMY WITH AXILLARY SENTINEL NODE BIOPSY (Left: Breast)  Patient location during evaluation: PACU Anesthesia Type: General Level of consciousness: awake and alert Pain management: pain level controlled Vital Signs Assessment: post-procedure vital signs reviewed and stable Respiratory status: spontaneous breathing, nonlabored ventilation, respiratory function stable and patient connected to nasal cannula oxygen Cardiovascular status: blood pressure returned to baseline and stable Postop Assessment: no apparent nausea or vomiting Anesthetic complications: no   No notable events documented.   Last Vitals:  Vitals:   05/26/21 1350 05/26/21 1355  BP: (!) 95/48   Pulse: 77 76  Resp: 18 18  Temp:    SpO2: 92% 91%    Last Pain:  Vitals:   05/26/21 1340  TempSrc:   PainSc: 0-No pain                 Precious Haws Brisha Mccabe

## 2021-05-26 NOTE — Discharge Instructions (Signed)

## 2021-05-26 NOTE — Anesthesia Procedure Notes (Signed)
Procedure Name: Intubation Date/Time: 05/26/2021 10:22 AM Performed by: Lily Peer, Kevork Joyce, CRNA Pre-anesthesia Checklist: Patient identified, Emergency Drugs available, Suction available and Patient being monitored Patient Re-evaluated:Patient Re-evaluated prior to induction Oxygen Delivery Method: Circle system utilized Preoxygenation: Pre-oxygenation with 100% oxygen Induction Type: IV induction Ventilation: Mask ventilation without difficulty Laryngoscope Size: McGraph and 3 Grade View: Grade I Tube type: Oral Tube size: 7.0 mm Number of attempts: 1 Airway Equipment and Method: Stylet Placement Confirmation: ETT inserted through vocal cords under direct vision, positive ETCO2 and breath sounds checked- equal and bilateral Secured at: 21 cm Tube secured with: Tape Dental Injury: Teeth and Oropharynx as per pre-operative assessment

## 2021-05-26 NOTE — Anesthesia Preprocedure Evaluation (Addendum)
Anesthesia Evaluation  Patient identified by MRN, date of birth, ID band Patient awake    Reviewed: Allergy & Precautions, NPO status , Patient's Chart, lab work & pertinent test results  History of Anesthesia Complications Negative for: history of anesthetic complications  Airway Mallampati: III  TM Distance: <3 FB Neck ROM: full    Dental  (+) Chipped, Poor Dentition, Missing, Edentulous Lower, Loose   Pulmonary shortness of breath and with exertion, sleep apnea , COPD, former smoker,    Pulmonary exam normal        Cardiovascular (-) anginaNormal cardiovascular exam     Neuro/Psych Seizures -, Well Controlled,  negative psych ROS   GI/Hepatic negative GI ROS, Neg liver ROS, neg GERD  ,  Endo/Other  diabetes, Type 2  Renal/GU      Musculoskeletal  (+) Arthritis ,   Abdominal   Peds  Hematology negative hematology ROS (+)   Anesthesia Other Findings Past Medical History: No date: Arthritis No date: Cancer ([place])     Comment:  left breast No date: COPD (chronic obstructive pulmonary disease) (HCC) No date: Diabetes mellitus without complication (HCC) No date: Dyspnea No date: Epilepsy ([place])     Comment:  no siezures in 43 years No date: Pneumonia     Comment:  possibly beginning of November 2022-pt states md did not              do cxr but was treating it as pneumonia No date: Sleep apnea     Comment:  could not tolerate cpap  Past Surgical History: No date: ABDOMINAL HYSTERECTOMY No date: APPENDECTOMY No date: BREAST BIOPSY; Right     Comment:  benign 15 years ago 05/05/2021: BREAST BIOPSY; Left     Comment:  stereo bx-calcs/"RIBBON" clip-path pending No date: CHOLECYSTECTOMY No date: COLONOSCOPY No date: JOINT REPLACEMENT; Bilateral     Comment:  2005 and 2018 knees  BMI    Body Mass Index: 51.67 kg/m      Reproductive/Obstetrics negative OB ROS                             Anesthesia Physical Anesthesia Plan  ASA: 3  Anesthesia Plan: General ETT   Post-op Pain Management:    Induction: Intravenous  PONV Risk Score and Plan: Ondansetron, Dexamethasone, Midazolam and Treatment may vary due to age or medical condition  Airway Management Planned: Oral ETT  Additional Equipment:   Intra-op Plan:   Post-operative Plan: Extubation in OR  Informed Consent: I have reviewed the patients History and Physical, chart, labs and discussed the procedure including the risks, benefits and alternatives for the proposed anesthesia with the patient or authorized representative who has indicated his/her understanding and acceptance.     Dental Advisory Given  Plan Discussed with: Anesthesiologist, CRNA and Surgeon  Anesthesia Plan Comments: (Patient consented for risks of anesthesia including but not limited to:  - adverse reactions to medications - damage to eyes, teeth, lips or other oral mucosa - nerve damage due to positioning  - sore throat or hoarseness - Damage to heart, brain, nerves, lungs, other parts of body or loss of life  Patient voiced understanding.)        Anesthesia Quick Evaluation

## 2021-05-27 ENCOUNTER — Encounter: Payer: Self-pay | Admitting: General Surgery

## 2021-05-28 ENCOUNTER — Other Ambulatory Visit: Payer: Self-pay | Admitting: Anatomic Pathology & Clinical Pathology

## 2021-05-28 LAB — SURGICAL PATHOLOGY

## 2021-06-17 ENCOUNTER — Other Ambulatory Visit: Payer: Self-pay

## 2021-06-17 ENCOUNTER — Inpatient Hospital Stay: Payer: Medicaid Other | Attending: Internal Medicine | Admitting: Internal Medicine

## 2021-06-17 DIAGNOSIS — C50312 Malignant neoplasm of lower-inner quadrant of left female breast: Secondary | ICD-10-CM | POA: Insufficient documentation

## 2021-06-17 DIAGNOSIS — Z170 Estrogen receptor positive status [ER+]: Secondary | ICD-10-CM | POA: Diagnosis not present

## 2021-06-17 NOTE — Progress Notes (Signed)
Patient still has drainage tubes from her surgery.  Patient was running a fever on Sunday and evaluated by Dr. Bary Castilla and was prescribed antibiotic and increased pain meds.  Still has increased pain that is 6-7/10 with walking but is improved since increase in pain meds.  Saturday night started having urinary incontinence that has improved but still has urinary urgency.

## 2021-06-17 NOTE — Progress Notes (Signed)
one [place] NOTE  Patient Care Team: Center, [facility] as PCP - General (General Practice) Rico Junker, RN as Registered Nurse Theodore Demark, RN as Registered Nurse Theodore Demark, RN as Oncology Nurse Navigator  CHIEF COMPLAINTS/PURPOSE OF CONSULTATION: Breast cancer  #  Oncology History Overview Note  DIAGNOSIS:  A. LEFT BREAST, 8:30; ULTRASOUND-GUIDED BIOPSY:  - INVASIVE MAMMARY CARCINOMA, NO SPECIAL TYPE.   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  Accompanied with family. Cheryl Silva 61 y.o.  female with newly diagnosed left-sided breast cancer is here s/p mastectomy is here for follow-up.  Patient is recovering from her surgery as expectantly.  She continues to have drains.  S/p evaluation with Dr. Bary Castilla this morning.  Concern for seroma.  Awaiting reevaluation with Dr. Bary Castilla.  Review of Systems  Constitutional:  Negative for chills, diaphoresis, fever, malaise/fatigue and weight loss.  HENT:  Negative for nosebleeds and sore throat.   Eyes:  Negative for double vision.  Respiratory:  Negative for cough, hemoptysis, sputum production, shortness of breath and wheezing.   Cardiovascular:  Negative for chest pain, palpitations, orthopnea and leg swelling.  Gastrointestinal:  Negative for abdominal pain, blood in  stool, constipation, diarrhea, heartburn, melena, nausea and vomiting.  Genitourinary:  Negative for dysuria, frequency and urgency.  Musculoskeletal:  Positive for joint pain. Negative for back pain.  Skin: Negative.  Negative for itching and rash.  Neurological:  Negative for dizziness, tingling, focal weakness, weakness and headaches.  Endo/Heme/Allergies:  Does not bruise/bleed easily.  Psychiatric/Behavioral:  Negative for depression. The patient is not nervous/anxious and does not have insomnia.     MEDICAL HISTORY:  Past Medical History:  Diagnosis Date   Arthritis    Cancer ([place])    left breast   COPD (chronic obstructive pulmonary disease) (HCC)    Diabetes mellitus without complication ([place])    Dyspnea    Epilepsy ([place])    no siezures in 43 years   Pneumonia    possibly beginning of November 2022-pt states md did not do cxr but was treating it as pneumonia   Sleep apnea    could not tolerate cpap    SURGICAL HISTORY: Past Surgical History:  Procedure Laterality Date   ABDOMINAL HYSTERECTOMY     APPENDECTOMY     BREAST BIOPSY Right    benign 15 years ago   BREAST BIOPSY Left 05/05/2021   stereo bx-calcs/"RIBBON" clip-path pending   CHOLECYSTECTOMY  COLONOSCOPY     JOINT REPLACEMENT Bilateral    2005 and 2018 knees   SIMPLE MASTECTOMY WITH AXILLARY SENTINEL NODE BIOPSY Left 05/26/2021   Procedure: SIMPLE MASTECTOMY WITH AXILLARY SENTINEL NODE BIOPSY;  Surgeon: Robert Bellow, MD;  Location: [facility] ORS;  Service: General;  Laterality: Left;    SOCIAL HISTORY: Social History   Socioeconomic History   Marital status: Married    Spouse name: Not on file   Number of children: Not on file   Years of education: Not on file   Highest education level: Not on file  Occupational History   Not on file  Tobacco Use   Smoking status: Former    Packs/day: 1.00    Years: 15.00    Pack years: 15.00    Types: Cigarettes    Quit date: 2002    Years since  quitting: 21.0   Smokeless tobacco: Never   Tobacco comments:    quit 20 years ago  Vaping Use   Vaping Use: Never used  Substance and Sexual Activity   Alcohol use: Not Currently   Drug use: Not Currently   Sexual activity: Not Currently  Other Topics Concern   Not on file  Social History Narrative   Patient lives in home with husband. Children and husband will be there to help out.   Feels safe in her home.  Does not work outside of home.   Social Determinants of Health   Financial Resource Strain: Not on file  Food Insecurity: Not on file  Transportation Needs: Not on file  Physical Activity: Not on file  Stress: Not on file  Social Connections: Not on file  Intimate Partner Violence: Not on file    FAMILY HISTORY: Family History  Problem Relation Age of Onset   Breast cancer Maternal Aunt 73    ALLERGIES:  is allergic to vicodin [hydrocodone-acetaminophen].  MEDICATIONS:  Current Outpatient Medications  Medication Sig Dispense Refill   acetaminophen (TYLENOL) 500 MG tablet Take 1,000 mg by mouth every 6 (six) hours as needed for moderate pain.     albuterol (VENTOLIN HFA) 108 (90 Base) MCG/ACT inhaler Inhale 2 puffs into the lungs every 6 (six) hours as needed for wheezing or shortness of breath.     fluticasone (FLOVENT HFA) 220 MCG/ACT inhaler Inhale 2 puffs into the lungs 2 (two) times daily.     gabapentin (NEURONTIN) 300 MG capsule Take 300 mg by mouth at bedtime.     JARDIANCE 10 MG TABS tablet Take 10 mg by mouth daily.     loratadine (CLARITIN) 10 MG tablet Take 10 mg by mouth daily as needed for allergies.     oxyCODONE (OXY IR/ROXICODONE) 5 MG immediate release tablet Take 5 mg by mouth every 4 (four) hours as needed.     sertraline (ZOLOFT) 100 MG tablet Take 150 mg by mouth at bedtime.     sulfamethoxazole-trimethoprim (BACTRIM DS) 800-160 MG tablet SMARTSIG:1 Tablet(s) By Mouth Every 12 Hours     No current facility-administered medications for this  visit.      Marland Kitchen  PHYSICAL EXAMINATION: ECOG PERFORMANCE STATUS: 0 - Asymptomatic  Vitals:   06/17/21 1318  BP: 133/81  Pulse: 75  Resp: 16  Temp: 99.8 F (37.7 C)   Filed Weights   06/17/21 1318  Weight: 293 lb 9.6 oz (133.2 kg)   Left mastectomy.  Drain in place. Physical Exam Vitals and nursing note reviewed.  Constitutional:      Comments:  HENT:     Head: Normocephalic and atraumatic.     Mouth/Throat:     Mouth: Mucous membranes are moist.     Pharynx: No oropharyngeal exudate.  Eyes:     Pupils: Pupils are equal, round, and reactive to light.  Cardiovascular:     Rate and Rhythm: Normal rate and regular rhythm.  Pulmonary:     Effort: No respiratory distress.     Breath sounds: No wheezing.     Comments: Decreased breath sounds bilaterally at bases.  No wheeze or crackles Abdominal:     General: Bowel sounds are normal. There is no distension.     Palpations: Abdomen is soft. There is no mass.     Tenderness: There is no abdominal tenderness. There is no guarding or rebound.  Musculoskeletal:        General: No tenderness. Normal range of motion.     Cervical back: Normal range of motion and neck supple.  Skin:    General: Skin is warm.  Neurological:     Mental Status: She is alert and oriented to person, place, and time.  Psychiatric:        Mood and Affect: Affect normal.        Judgment: Judgment normal.     LABORATORY DATA:  I have reviewed the data as listed Lab Results  Component Value Date   WBC 6.1 05/21/2021   HGB 14.2 05/21/2021   HCT 43.0 05/21/2021   MCV 93.7 05/21/2021   PLT 227 05/21/2021   Recent Labs    06/21/20 2126 05/21/21 1223  NA 140 139  K 3.7 3.8  CL 108 107  CO2 22 25  GLUCOSE 193* 158*  BUN 11 20  CREATININE 0.67 0.59  CALCIUM 8.7* 8.9  GFRNONAA >60 >60  PROT 6.8  --   ALBUMIN 3.7  --   AST 19  --   ALT 23  --   ALKPHOS 67  --   BILITOT 0.9  --     RADIOGRAPHIC STUDIES: I have personally  reviewed the radiological images as listed and agreed with the findings in the report. NM Sentinel Node Inj-No Rpt (Breast)  Result Date: 05/26/2021 Sulfur Colloid was injected by the Nuclear Medicine Technologist for sentinel lymph node localization.    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  Given T1b tumor/grade 1G-1; no lymphovascular invasion-would not recommend oncotype testing.  Patient will not benefit from chemotherapy.  No role for any adjuvant radiation.  #Discussed the mechanism of action of aromatase inhibitors-with blocking of estrogen to prevent breast cancer.  Also discussed the potential side effects including but not limited to arthralgias hot flashes and increased risk of osteoporosis.plan anastrazole at next visit.  We will also order bone density at next visit.  # DISPOSITION: # follow up in 6 weeks- Dr.B  All questions were answered. The patient/family knows to call the clinic with any problems, questions or concerns.    Cammie Sickle, MD 06/17/2021 3:57 PM

## 2021-06-17 NOTE — Assessment & Plan Note (Addendum)
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  Given T1b tumor/grade 1G-1; no lymphovascular invasion-would not recommend oncotype testing.  Patient will not benefit from chemotherapy.  No role for any adjuvant radiation.  #Discussed the mechanism of action of aromatase inhibitors-with blocking of estrogen to prevent breast cancer.  Also discussed the potential side effects including but not limited to arthralgias hot flashes and increased risk of osteoporosis.plan anastrazole at next visit.  We will also order bone density at next visit.  # DISPOSITION: # follow up in 6 weeks- Dr.B

## 2021-06-19 ENCOUNTER — Ambulatory Visit: Payer: Self-pay | Admitting: Internal Medicine

## 2021-06-24 NOTE — Progress Notes (Signed)
HSIS sent to [facility].

## 2021-07-29 ENCOUNTER — Inpatient Hospital Stay: Payer: Medicaid Other | Attending: Internal Medicine | Admitting: Internal Medicine

## 2021-07-29 ENCOUNTER — Encounter: Payer: Self-pay | Admitting: Internal Medicine

## 2021-07-29 ENCOUNTER — Other Ambulatory Visit: Payer: Self-pay

## 2021-07-29 DIAGNOSIS — Z87891 Personal history of nicotine dependence: Secondary | ICD-10-CM | POA: Insufficient documentation

## 2021-07-29 DIAGNOSIS — Z170 Estrogen receptor positive status [ER+]: Secondary | ICD-10-CM | POA: Diagnosis not present

## 2021-07-29 DIAGNOSIS — Z79811 Long term (current) use of aromatase inhibitors: Secondary | ICD-10-CM | POA: Diagnosis not present

## 2021-07-29 DIAGNOSIS — C50312 Malignant neoplasm of lower-inner quadrant of left female breast: Secondary | ICD-10-CM | POA: Insufficient documentation

## 2021-07-29 DIAGNOSIS — R634 Abnormal weight loss: Secondary | ICD-10-CM | POA: Insufficient documentation

## 2021-07-29 MED ORDER — ANASTROZOLE 1 MG PO TABS: 1.0000 mg | ORAL_TABLET | Freq: Every day | ORAL | 4 refills | Status: DC

## 2021-07-29 NOTE — Progress Notes (Signed)
one [place] NOTE  Patient Care Team: Center, [facility] as PCP - General (General Practice) Rico Junker, RN as Registered Nurse Theodore Demark, RN as Registered Nurse Theodore Demark, RN as Oncology Nurse Navigator  CHIEF COMPLAINTS/PURPOSE OF CONSULTATION: Breast cancer  #  Oncology History Overview Note  DIAGNOSIS:  A. LEFT BREAST, 8:30; ULTRASOUND-GUIDED BIOPSY:  - INVASIVE MAMMARY CARCINOMA, NO SPECIAL TYPE.   Size of invasive carcinoma: 4 mm in this sample  Histologic grade of invasive carcinoma: Grade 2                       Glandular/tubular differentiation score: 3                       Nuclear pleomorphism score: 2                       Mitotic rate score: 1                       Total score: 6  Ductal carcinoma in situ: Not identified  Lymphovascular invasion: Not identified  PATHOLOGIC STAGE CLASSIFICATION (pTNM, AJCC 8th Edition):  TNM Descriptors: Not applicable  pT Category: pT1b  Regional Lymph Nodes Modifier:  pN Category: pN0  pM Category: Not applicable   SPECIAL STUDIES  Breast Biomarker Testing Performed on Previous Biopsy: [id number]  Estrogen Receptor (ER) Status: POSITIVE          Percentage of cells with nuclear positivity: 91-100%          Average intensity of staining: Strong   Progesterone Receptor (PgR) Status: POSITIVE          Percentage of cells with nuclear positivity: 81-90%          Average intensity of staining: Moderate   HER2 (by immunohistochemistry): NEGATIVE (Score 0)  Ki-67: Not performed  JAN 2023- pT1b:G-1 [s/p mastectomy- pt pref; Dr.Byrnett]   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  Accompanied with family.  Cheryl Silva 61 y.o.  female stage I ER/PR positive HER2 negative left-sided breast cancer is here s/p mastectomy is here for follow-up.   Patient has recovered well from surgery.  Drains are out.  No fever no chills. Review of Systems  Constitutional:  Negative for chills, diaphoresis, fever, malaise/fatigue and weight loss.  HENT:  Negative for nosebleeds and sore throat.   Eyes:  Negative for double vision.  Respiratory:  Negative for cough, hemoptysis, sputum production, shortness of breath and wheezing.   Cardiovascular:  Negative for chest pain, palpitations, orthopnea and leg swelling.  Gastrointestinal:  Negative for abdominal pain, blood in stool, constipation, diarrhea, heartburn, melena, nausea and vomiting.  Genitourinary:  Negative for dysuria, frequency and urgency.  Musculoskeletal:  Positive for joint pain. Negative for back pain.  Skin: Negative.  Negative for itching and rash.  Neurological:  Negative for dizziness, tingling, focal weakness, weakness and headaches.  Endo/Heme/Allergies:  Does  not bruise/bleed easily.  Psychiatric/Behavioral:  Negative for depression. The patient is not nervous/anxious and does not have insomnia.     MEDICAL HISTORY:  Past Medical History:  Diagnosis Date   Arthritis    Cancer ([place])    left breast   COPD (chronic obstructive pulmonary disease) (HCC)    Diabetes mellitus without complication (HCC)    Dyspnea    Epilepsy ([place])    no siezures in 43 years   Pneumonia    possibly beginning of November 2022-pt states md did not do cxr but was treating it as pneumonia   Sleep apnea    could not tolerate cpap    SURGICAL HISTORY: Past Surgical History:  Procedure Laterality Date   ABDOMINAL HYSTERECTOMY     APPENDECTOMY     BREAST BIOPSY  Right    benign 15 years ago   BREAST BIOPSY Left 05/05/2021   stereo bx-calcs/"RIBBON" clip-path pending   CHOLECYSTECTOMY     COLONOSCOPY     JOINT REPLACEMENT Bilateral    2005 and 2018 knees   SIMPLE MASTECTOMY WITH AXILLARY SENTINEL NODE BIOPSY Left 05/26/2021   Procedure: SIMPLE MASTECTOMY WITH AXILLARY SENTINEL NODE BIOPSY;  Surgeon: Robert Bellow, MD;  Location: [facility] ORS;  Service: General;  Laterality: Left;    SOCIAL HISTORY: Social History   Socioeconomic History   Marital status: Married    Spouse name: Not on file   Number of children: Not on file   Years of education: Not on file   Highest education level: Not on file  Occupational History   Not on file  Tobacco Use   Smoking status: Former    Packs/day: 1.00    Years: 15.00    Pack years: 15.00    Types: Cigarettes    Quit date: 2002    Years since quitting: 21.1   Smokeless tobacco: Never   Tobacco comments:    quit 20 years ago  Vaping Use   Vaping Use: Never used  Substance and Sexual Activity   Alcohol use: Not Currently   Drug use: Not Currently   Sexual activity: Not Currently  Other Topics Concern   Not on file  Social History Narrative   Patient lives in home with husband. Children and husband will be there to help out.   Feels safe in her home.  Does not work outside of home.   Social Determinants of Health   Financial Resource Strain: Not on file  Food Insecurity: Not on file  Transportation Needs: Not on file  Physical Activity: Not on file  Stress: Not on file  Social Connections: Not on file  Intimate Partner Violence: Not on file    FAMILY HISTORY: Family History  Problem Relation Age of Onset   Breast cancer Maternal Aunt 35    ALLERGIES:  is allergic to vicodin [hydrocodone-acetaminophen].  MEDICATIONS:  Current Outpatient Medications  Medication Sig Dispense Refill   acetaminophen (TYLENOL) 500 MG tablet Take 1,000 mg by mouth every 6 (six) hours as needed for  moderate pain.     albuterol (VENTOLIN HFA) 108 (90 Base) MCG/ACT inhaler Inhale 2 puffs into the lungs every 6 (six) hours as needed for wheezing or shortness of breath.     anastrozole (ARIMIDEX) 1 MG tablet Take 1 tablet (1 mg total) by mouth daily. 30 tablet 4   fluticasone (FLOVENT HFA) 220 MCG/ACT inhaler Inhale 2 puffs into the lungs 2 (two) times daily.     gabapentin (NEURONTIN) 300 MG capsule  Take 300 mg by mouth at bedtime.     JARDIANCE 10 MG TABS tablet Take 10 mg by mouth daily.     loratadine (CLARITIN) 10 MG tablet Take 10 mg by mouth daily as needed for allergies.     sertraline (ZOLOFT) 100 MG tablet Take 150 mg by mouth at bedtime.     sulfamethoxazole-trimethoprim (BACTRIM DS) 800-160 MG tablet SMARTSIG:1 Tablet(s) By Mouth Every 12 Hours     No current facility-administered medications for this visit.      Marland Kitchen  PHYSICAL EXAMINATION: ECOG PERFORMANCE STATUS: 0 - Asymptomatic  Vitals:   07/29/21 1412  BP: 129/87  Pulse: 83  Temp: 99.2 F (37.3 C)  SpO2: 94%   Filed Weights   07/29/21 1412  Weight: (!) 300 lb 9.6 oz (136.4 kg)   Left mastectomy.  Drain in place. Physical Exam Vitals and nursing note reviewed.  Constitutional:      Comments:     HENT:     Head: Normocephalic and atraumatic.     Mouth/Throat:     Mouth: Mucous membranes are moist.     Pharynx: No oropharyngeal exudate.  Eyes:     Pupils: Pupils are equal, round, and reactive to light.  Cardiovascular:     Rate and Rhythm: Normal rate and regular rhythm.  Pulmonary:     Effort: No respiratory distress.     Breath sounds: No wheezing.     Comments: Decreased breath sounds bilaterally at bases.  No wheeze or crackles Abdominal:     General: Bowel sounds are normal. There is no distension.     Palpations: Abdomen is soft. There is no mass.     Tenderness: There is no abdominal tenderness. There is no guarding or rebound.  Musculoskeletal:        General: No tenderness. Normal range of  motion.     Cervical back: Normal range of motion and neck supple.  Skin:    General: Skin is warm.  Neurological:     Mental Status: She is alert and oriented to person, place, and time.  Psychiatric:        Mood and Affect: Affect normal.        Judgment: Judgment normal.     LABORATORY DATA:  I have reviewed the data as listed Lab Results  Component Value Date   WBC 6.1 05/21/2021   HGB 14.2 05/21/2021   HCT 43.0 05/21/2021   MCV 93.7 05/21/2021   PLT 227 05/21/2021   Recent Labs    05/21/21 1223  NA 139  K 3.8  CL 107  CO2 25  GLUCOSE 158*  BUN 20  CREATININE 0.59  CALCIUM 8.9  GFRNONAA >60    RADIOGRAPHIC STUDIES: I have personally reviewed the radiological images as listed and agreed with the findings in the report. No results found.  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  Given T1b tumor/grade 1G-1; no lymphovascular invasion-would not recommend oncotype testing.  Patient will not benefit from chemotherapy.  No role for any adjuvant radiation.  #Discussed the mechanism of action of aromatase inhibitors-with blocking of estrogen to prevent breast cancer.  Also discussed the potential side effects including but not limited to arthralgias hot flashes and increased risk of osteoporosis. We will also order bone density at next visit.  Prescription sent for anastrozole.  # Screening for  osteoprosis: recommend bone density prior to next visit.  # obseity: Counseled the patient regarding weight loss.  # DISPOSITION: # follow up in 1 month; MD; BMD prior-- Dr.B  All questions were answered. The patient/family knows to call the clinic with any problems, questions or concerns.    Cammie Sickle, MD 08/04/2021 7:57 PM

## 2021-07-29 NOTE — Patient Instructions (Addendum)
#  Recommend Os-Cal plus vitamin D over-the-counter twice a day.

## 2021-07-29 NOTE — Assessment & Plan Note (Addendum)
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  Given T1b tumor/grade 1G-1; no lymphovascular invasion-would not recommend oncotype testing.  Patient will not benefit from chemotherapy.  No role for any adjuvant radiation.  #Discussed the mechanism of action of aromatase inhibitors-with blocking of estrogen to prevent breast cancer.  Also discussed the potential side effects including but not limited to arthralgias hot flashes and increased risk of osteoporosis. We will also order bone density at next visit.  Prescription sent for anastrozole.  # Screening for osteoprosis: recommend bone density prior to next visit.  # obseity: Counseled the patient regarding weight loss.  # DISPOSITION: # follow up in 1 month; MD; BMD prior-- Dr.B

## 2021-08-12 ENCOUNTER — Ambulatory Visit
Admission: RE | Admit: 2021-08-12 | Discharge: 2021-08-12 | Disposition: A | Discharge status: Home / Self Care | Payer: Medicaid Other | Source: Ambulatory Visit | Attending: Internal Medicine | Admitting: Internal Medicine

## 2021-08-12 ENCOUNTER — Other Ambulatory Visit: Payer: Self-pay

## 2021-08-12 DIAGNOSIS — Z170 Estrogen receptor positive status [ER+]: Secondary | ICD-10-CM

## 2021-08-12 DIAGNOSIS — C50312 Malignant neoplasm of lower-inner quadrant of left female breast: Secondary | ICD-10-CM | POA: Diagnosis not present

## 2021-08-26 ENCOUNTER — Inpatient Hospital Stay: Payer: Medicaid Other | Attending: Internal Medicine | Admitting: Internal Medicine

## 2021-08-26 ENCOUNTER — Encounter: Payer: Self-pay | Admitting: Internal Medicine

## 2021-08-26 ENCOUNTER — Other Ambulatory Visit: Payer: Self-pay

## 2021-08-26 VITALS — BP 128/70 | HR 74 | Temp 98.7°F | Ht 64.0 in | Wt 298.2 lb

## 2021-08-26 DIAGNOSIS — Z7951 Long term (current) use of inhaled steroids: Secondary | ICD-10-CM | POA: Diagnosis not present

## 2021-08-26 DIAGNOSIS — M791 Myalgia, unspecified site: Secondary | ICD-10-CM | POA: Diagnosis not present

## 2021-08-26 DIAGNOSIS — R519 Headache, unspecified: Secondary | ICD-10-CM | POA: Insufficient documentation

## 2021-08-26 DIAGNOSIS — Z170 Estrogen receptor positive status [ER+]: Secondary | ICD-10-CM | POA: Diagnosis not present

## 2021-08-26 DIAGNOSIS — Z87891 Personal history of nicotine dependence: Secondary | ICD-10-CM | POA: Diagnosis not present

## 2021-08-26 DIAGNOSIS — E1165 Type 2 diabetes mellitus with hyperglycemia: Secondary | ICD-10-CM | POA: Diagnosis not present

## 2021-08-26 DIAGNOSIS — R232 Flushing: Secondary | ICD-10-CM | POA: Insufficient documentation

## 2021-08-26 DIAGNOSIS — N951 Menopausal and female climacteric states: Secondary | ICD-10-CM | POA: Insufficient documentation

## 2021-08-26 DIAGNOSIS — C50312 Malignant neoplasm of lower-inner quadrant of left female breast: Secondary | ICD-10-CM | POA: Diagnosis present

## 2021-08-26 DIAGNOSIS — Z9012 Acquired absence of left breast and nipple: Secondary | ICD-10-CM | POA: Diagnosis not present

## 2021-08-26 DIAGNOSIS — Z79899 Other long term (current) drug therapy: Secondary | ICD-10-CM | POA: Insufficient documentation

## 2021-08-26 DIAGNOSIS — Z79811 Long term (current) use of aromatase inhibitors: Secondary | ICD-10-CM | POA: Diagnosis not present

## 2021-08-26 NOTE — Progress Notes (Signed)
Pt states since she started the Arimidex, she is having hot flashes and tiredness. ?

## 2021-08-26 NOTE — Progress Notes (Signed)
[place] ?CONSULT NOTE ? ?Patient Care Team: ?Center, [facility] as PCP - General (General Practice) ?Rico Junker, RN as Registered Nurse ?Theodore Demark, RN as Registered Nurse ?Theodore Demark, RN as Oncology Nurse Navigator ?Cammie Sickle, MD as Consulting Physician (Internal Medicine) ?Robert Bellow, MD as Consulting Physician (General Surgery) ? ?CHIEF COMPLAINTS/PURPOSE OF CONSULTATION: Breast cancer ? ?#  ?Oncology History Overview Note  ?DIAGNOSIS:  ?A. LEFT BREAST, 8:30; ULTRASOUND-GUIDED BIOPSY:  ?- INVASIVE MAMMARY CARCINOMA, NO SPECIAL TYPE.  ? ?Size of invasive carcinoma: 4 mm in this sample  ?Histologic grade of invasive carcinoma: Grade 2  ?                     Glandular/tubular differentiation score: 3  ?                     Nuclear pleomorphism score: 2  ?                     Mitotic rate score: 1  ?                     Total score: 6  ?Ductal carcinoma in situ: Not identified  ?Lymphovascular invasion: Not identified  ?PATHOLOGIC STAGE CLASSIFICATION (pTNM, AJCC 8th Edition):  ?TNM Descriptors: Not applicable  ?pT Category: pT1b  ?Regional Lymph Nodes Modifier:  ?pN Category: pN0  ?pM Category: Not applicable  ? ?SPECIAL STUDIES  ?Breast Biomarker Testing Performed on Previous Biopsy: [id number]  ?Estrogen Receptor (ER) Status: POSITIVE  ?        Percentage of cells with nuclear positivity: 91-100%  ?        Average intensity of staining: Strong  ? ?Progesterone Receptor (PgR) Status: POSITIVE  ?        Percentage of cells with nuclear positivity: 81-90%  ?        Average intensity of staining: Moderate  ? ?HER2 (by immunohistochemistry): NEGATIVE (Score 0)  ?Ki-67: Not performed ? ?JAN 2023- pT1b:G-1 [s/p mastectomy- pt pref; Dr.Byrnett] ?  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 Carcinoma of lower-inner quadrant of left breast in female, estrogen receptor  positive ([place]) ?  ?07/29/2021 Cancer Staging  ? Staging form: Breast, AJCC 8th Edition ?- Pathologic: Stage IA (pT1b, pN0, cM0, G1, ER+, PR+, HER2-) - Signed by Cammie Sickle, MD on 07/29/2021 ?Histologic grading system: 3 grade system ? ?  ? ? ? ?HISTORY OF PRESENTING ILLNESS: Ambulating independently.  Accompanied with family. ? ?KRISLYN DONNAN 61 y.o.  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 ? ?Patient complains of worsening fatigue.  Also complaining of worsening hot flashes.  States her diabetes is not under good control.  Complains of intermittent headaches.  Complains of myalgias. ? ?Review of Systems  ?Constitutional:  Negative for chills, diaphoresis, fever, malaise/fatigue and weight loss.  ?HENT:  Negative for nosebleeds and sore throat.   ?Eyes:  Negative for double vision.  ?Respiratory:  Negative for cough, hemoptysis, sputum production, shortness of breath and wheezing.   ?Cardiovascular:  Negative for chest pain, palpitations, orthopnea and leg swelling.  ?Gastrointestinal:  Negative for abdominal pain, blood in stool, constipation, diarrhea, heartburn, melena, nausea and vomiting.  ?Genitourinary:  Negative for dysuria, frequency and urgency.  ?  Musculoskeletal:  Positive for joint pain. Negative for back pain.  ?Skin: Negative.  Negative for itching and rash.  ?Neurological:  Negative for dizziness, tingling, focal weakness, weakness and headaches.  ?Endo/Heme/Allergies:  Does not bruise/bleed easily.  ?Psychiatric/Behavioral:  Negative for depression. The patient is not nervous/anxious and does not have insomnia.    ? ?MEDICAL HISTORY:  ?Past Medical History:  ?Diagnosis Date  ? Arthritis   ? Cancer [facility])   ? left breast  ? COPD (chronic obstructive pulmonary disease) ([place])   ? Diabetes mellitus without complication ([place])   ? Dyspnea   ? Epilepsy ([place])   ? no siezures in 43 years  ? Pneumonia    ? possibly beginning of November 2022-pt states md did not do cxr but was treating it as pneumonia  ? Sleep apnea   ? could not tolerate cpap  ? ? ?SURGICAL HISTORY: ?Past Surgical History:  ?Procedure Laterality Date  ? ABDOMINAL HYSTERECTOMY    ? APPENDECTOMY    ? BREAST BIOPSY Right   ? benign 15 years ago  ? BREAST BIOPSY Left 05/05/2021  ? stereo bx-calcs/"RIBBON" clip-path pending  ? CHOLECYSTECTOMY    ? COLONOSCOPY    ? JOINT REPLACEMENT Bilateral   ? 2005 and 2018 knees  ? SIMPLE MASTECTOMY WITH AXILLARY SENTINEL NODE BIOPSY Left 05/26/2021  ? Procedure: SIMPLE MASTECTOMY WITH AXILLARY SENTINEL NODE BIOPSY;  Surgeon: Robert Bellow, MD;  Location: [facility] ORS;  Service: General;  Laterality: Left;  ? ? ?SOCIAL HISTORY: ?Social History  ? ?Socioeconomic History  ? Marital status: Married  ?  Spouse name: Not on file  ? Number of children: Not on file  ? Years of education: Not on file  ? Highest education level: Not on file  ?Occupational History  ? Not on file  ?Tobacco Use  ? Smoking status: Former  ?  Packs/day: 1.00  ?  Years: 15.00  ?  Pack years: 15.00  ?  Types: Cigarettes  ?  Quit date: 2002  ?  Years since quitting: 21.2  ? Smokeless tobacco: Never  ? Tobacco comments:  ?  quit 20 years ago  ?Vaping Use  ? Vaping Use: Never used  ?Substance and Sexual Activity  ? Alcohol use: Not Currently  ? Drug use: Not Currently  ? Sexual activity: Not Currently  ?Other Topics Concern  ? Not on file  ?Social History Narrative  ? Patient lives in home with husband. Children and husband will be there to help out.  ? Feels safe in her home.  Does not work outside of home.  ? ?Social Determinants of Health  ? ?Financial Resource Strain: Not on file  ?Food Insecurity: Not on file  ?Transportation Needs: Not on file  ?Physical Activity: Not on file  ?Stress: Not on file  ?Social Connections: Not on file  ?Intimate Partner Violence: Not on file  ? ? ?FAMILY HISTORY: ?Family History  ?Problem Relation Age of Onset  ?  Breast cancer Maternal Aunt 60  ? ? ?ALLERGIES:  is allergic to vicodin [hydrocodone-acetaminophen]. ? ?MEDICATIONS:  ?Current Outpatient Medications  ?Medication Sig Dispense Refill  ? acetaminophen (TYLENOL) 500 MG tablet Take 1,000 mg by mouth every 6 (six) hours as needed for moderate pain.    ? albuterol (VENTOLIN HFA) 108 (90 Base) MCG/ACT inhaler Inhale 2 puffs into the lungs every 6 (six) hours as needed for wheezing or shortness of breath.    ? anastrozole (ARIMIDEX) 1 MG tablet Take 1 tablet (1  mg total) by mouth daily. 30 tablet 4  ? fluticasone (FLOVENT HFA) 220 MCG/ACT inhaler Inhale 2 puffs into the lungs 2 (two) times daily.    ? gabapentin (NEURONTIN) 300 MG capsule Take 300 mg by mouth at bedtime.    ? JARDIANCE 10 MG TABS tablet Take 25 mg by mouth daily.    ? loratadine (CLARITIN) 10 MG tablet Take 10 mg by mouth daily as needed for allergies.    ? sertraline (ZOLOFT) 100 MG tablet Take 150 mg by mouth at bedtime.    ? sulfamethoxazole-trimethoprim (BACTRIM DS) 800-160 MG tablet SMARTSIG:1 Tablet(s) By Mouth Every 12 Hours    ? atorvastatin (LIPITOR) 20 MG tablet Take 20 mg by mouth daily.    ? ?No current facility-administered medications for this visit.  ? ? ?  ?. ? ?PHYSICAL EXAMINATION: ?ECOG PERFORMANCE STATUS: 0 - Asymptomatic ? ?Vitals:  ? 08/26/21 1428  ?BP: 128/70  ?Pulse: 74  ?Temp: 98.7 ?F (37.1 ?C)  ?SpO2: 97%  ? ?Filed Weights  ? 08/26/21 1428  ?Weight: 298 lb 3.2 oz (135.3 kg)  ? ?Left mastectomy.  Drain in place. ?Physical Exam ?Vitals and nursing note reviewed.  ?Constitutional:   ?   Comments:  ? ?  ?HENT:  ?   Head: Normocephalic and atraumatic.  ?   Mouth/Throat:  ?   Mouth: Mucous membranes are moist.  ?   Pharynx: No oropharyngeal exudate.  ?Eyes:  ?   Pupils: Pupils are equal, round, and reactive to light.  ?Cardiovascular:  ?   Rate and Rhythm: Normal rate and regular rhythm.  ?Pulmonary:  ?   Effort: No respiratory distress.  ?   Breath sounds: No wheezing.  ?   Comments:  Decreased breath sounds bilaterally at bases.  No wheeze or crackles ?Abdominal:  ?   General: Bowel sounds are normal. There is no distension.  ?   Palpations: Abdomen is soft. There is no mass.  ?   Tend

## 2021-08-26 NOTE — Progress Notes (Signed)
Survivorship Care Plan visit completed.  Treatment summary reviewed and given to patient.  ASCO answers booklet reviewed and given to patient.  CARE program and Cancer Transitions discussed with patient along with other resources cancer center offers to patients and caregivers.  Patient verbalized understanding.    

## 2021-08-26 NOTE — Assessment & Plan Note (Signed)
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  Given T1b tumor/grade 1G-1; no lymphovascular invasion-would not recommend oncotype testing.  Patient will not benefit from chemotherapy.  No role for any adjuvant radiation. ? ?# currently on anastrozole [mid Feb 2023]-tolerating with mild to moderate side effects of worsening fatigue/myalgias hot flashes ? ?#Hot flashes-grade 1-2 monitor for now.  If worse would recommend Effexor. ? ?# Screening for osteoprosis: FEB 2023- BMDT-score of 0.0. continue ca+vitD BID.  ? ?# fatigue: multifactorial- [poorly controlled DM; recent surgery; ]referral to CARE program ? ?# obseity: Counseled the patient regarding weight loss. ? ?# DISPOSITION: ?# referral to CARE program AQ:[id number]  ?# follow up in 3 month; MD;-no labs- Dr.B ?

## 2021-09-24 IMAGING — MG DIGITAL SCREENING BILAT W/ TOMO W/ CAD
6 of 12 series · 6 of 36 positions shown · non-contrast
Comparison: Previous exam(s).

ACR Breast Density Category a: The breast tissue is almost entirely
fatty.

CLINICAL DATA: Screening.

EXAM:
DIGITAL SCREENING BILATERAL MAMMOGRAM WITH TOMO AND CAD

[L CC synth-2D (1 of 2)]
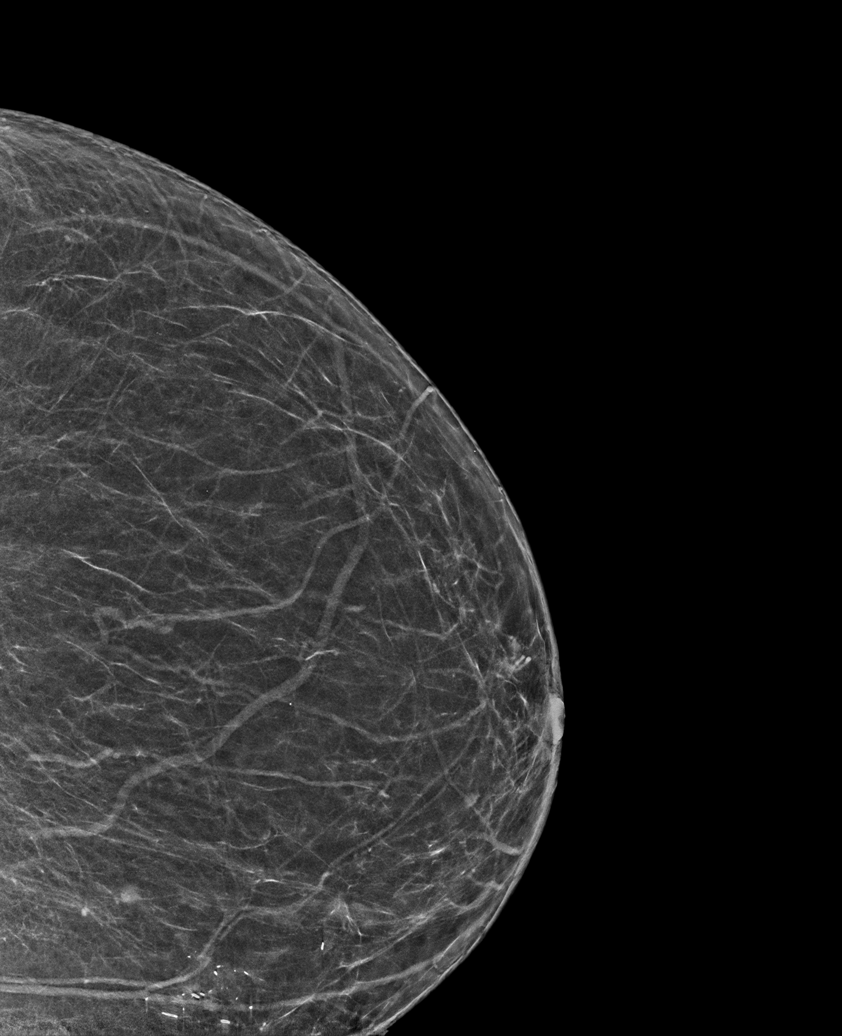

[L MLO synth-2D]
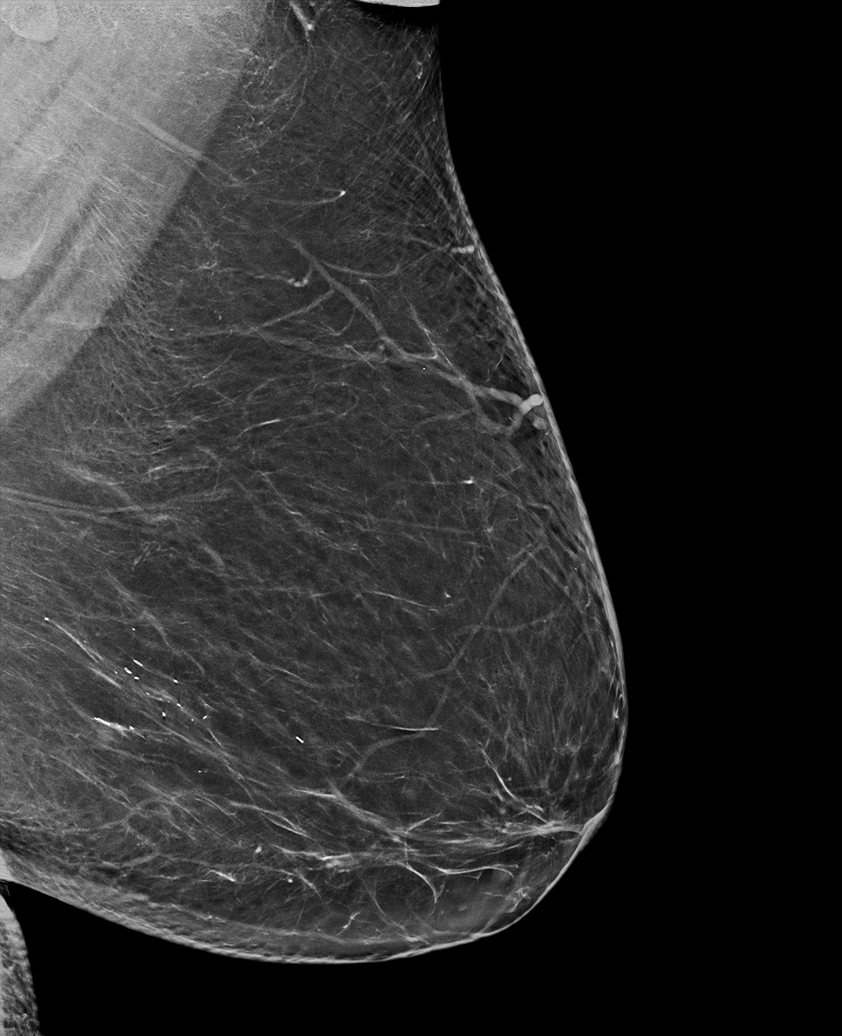

[R CC synth-2D (1 of 2)]
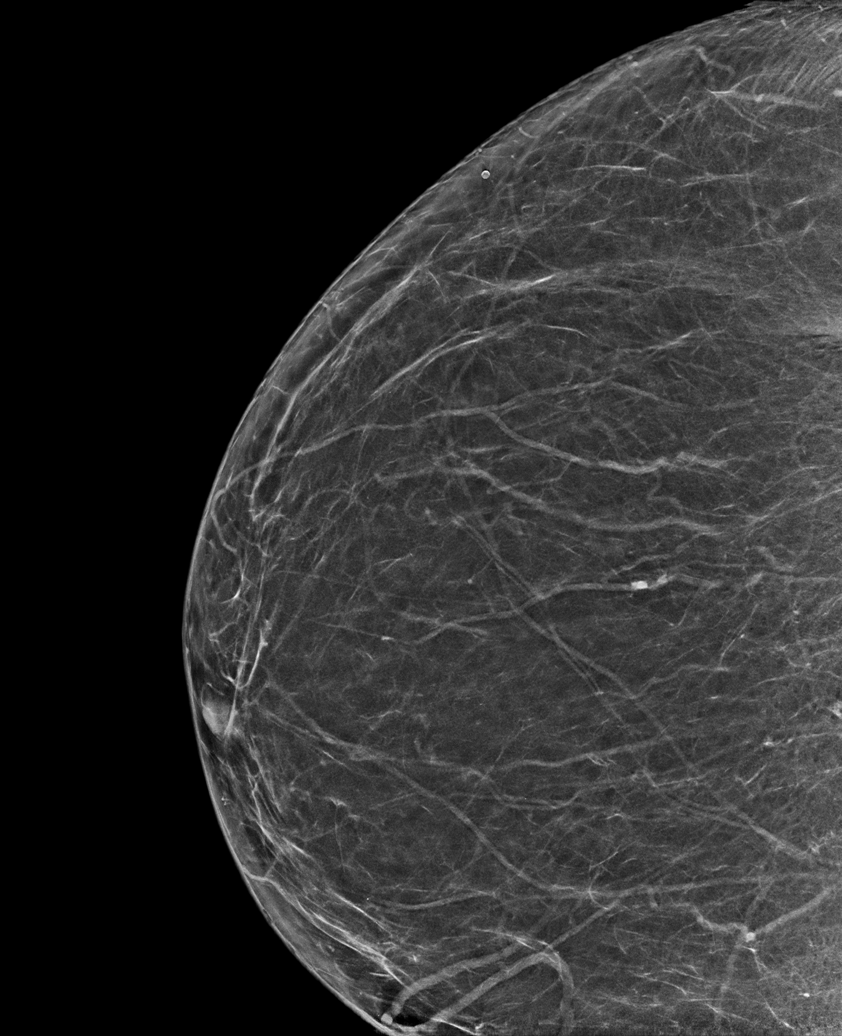

[R MLO synth-2D]
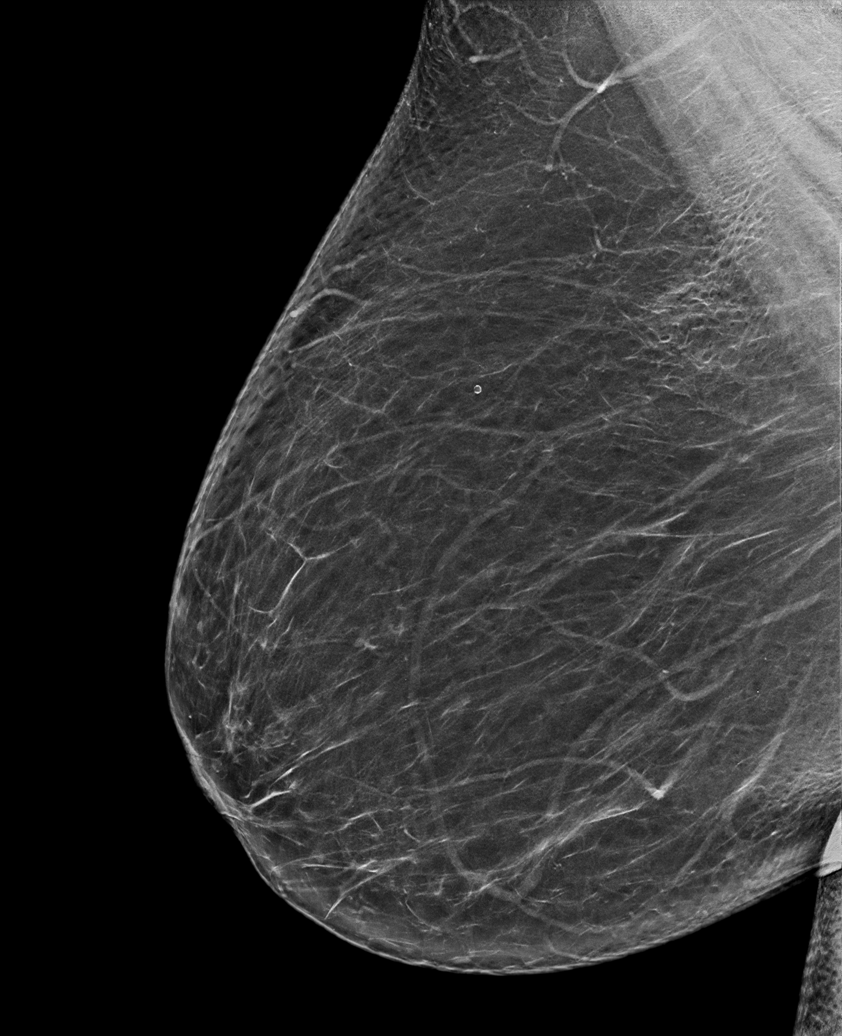

[R CC synth-2D (2 of 2)]
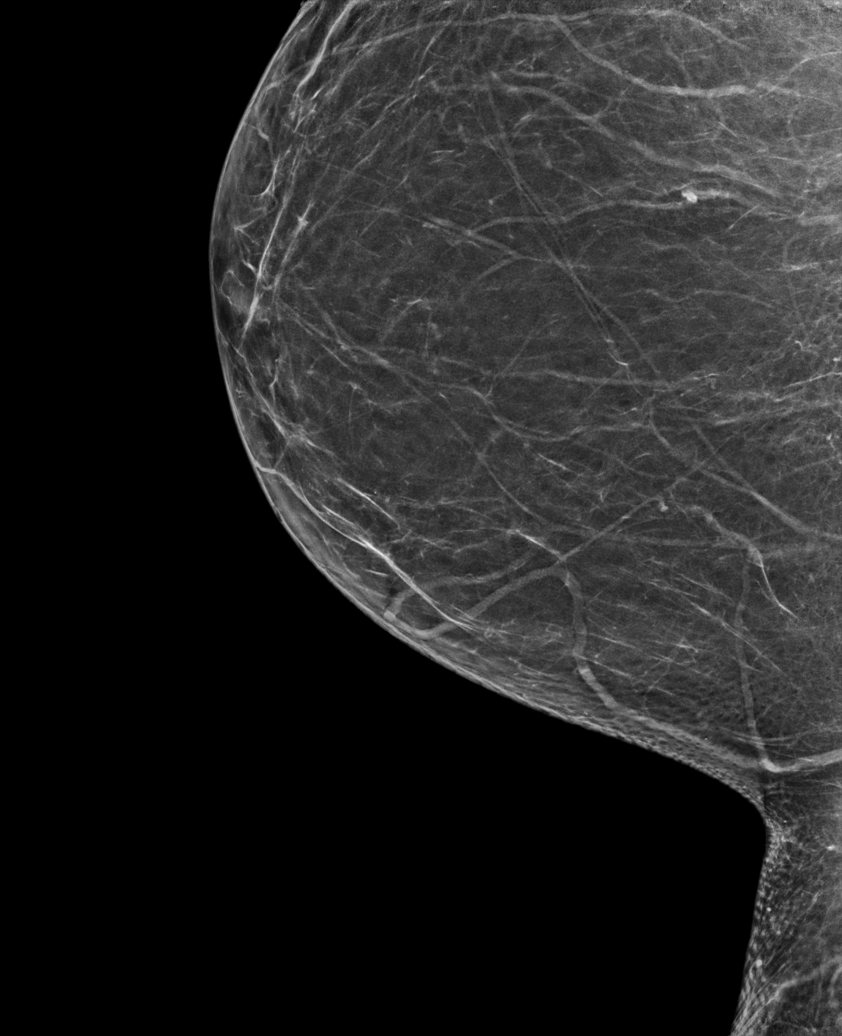

[L CC synth-2D (2 of 2)]
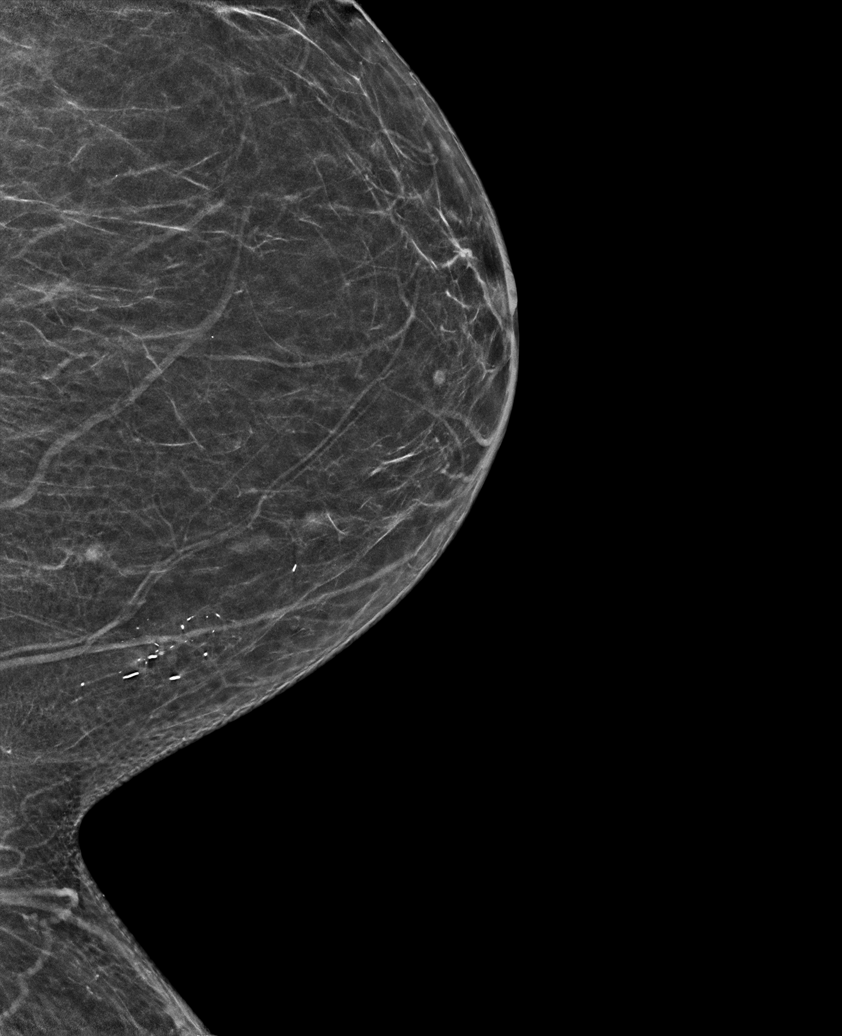

[6 of 36 positions shown; findings below may reference images not displayed]

FINDINGS: There are no findings suspicious for malignancy. Images were
processed with CAD.
IMPRESSION: No mammographic evidence of malignancy. A result letter of this
screening mammogram will be mailed directly to the patient.

RECOMMENDATION:
Screening mammogram in one year. (Code:[id number])

BI-RADS CATEGORY  1: Negative.

## 2021-09-29 ENCOUNTER — Encounter: Payer: Medicaid Other | Attending: Internal Medicine

## 2021-09-29 DIAGNOSIS — Z170 Estrogen receptor positive status [ER+]: Secondary | ICD-10-CM

## 2021-09-29 NOTE — Progress Notes (Signed)
Spoke with patient regarding CARE program. Medications and medical history were reviewed. All interview questions were answered. Patient due for 6MWT tomorrow, 4/18. ?

## 2021-09-30 ENCOUNTER — Other Ambulatory Visit: Payer: Self-pay

## 2021-09-30 DIAGNOSIS — Z170 Estrogen receptor positive status [ER+]: Secondary | ICD-10-CM

## 2021-10-14 ENCOUNTER — Encounter: Payer: Medicaid Other | Attending: Internal Medicine

## 2021-10-14 VITALS — Ht 65.0 in | Wt 301.4 lb

## 2021-10-14 DIAGNOSIS — Z170 Estrogen receptor positive status [ER+]: Secondary | ICD-10-CM | POA: Insufficient documentation

## 2021-10-14 DIAGNOSIS — C50312 Malignant neoplasm of lower-inner quadrant of left female breast: Secondary | ICD-10-CM | POA: Insufficient documentation

## 2021-10-14 NOTE — Progress Notes (Signed)
CARE Daily Session Note ? ?Patient Details  ?Name: Cheryl Silva ?MRN: [id number] ?Date of Birth: 1960/08/17 ?Referring Provider:   ?Flowsheet Row Cancer Associated Rehabilitation & Exercise from 10/14/2021 in [facility] Cardiac and Pulmonary Rehab  ?Referring Provider Charlaine Dalton MD  ? ?  ? ? ?Encounter Date: 10/14/2021 ? ?Check In: ? Session Check In - 10/14/21 1420   ? ?  ? Check-In  ? Supervising physician immediately available to respond to emergencies See telemetry face sheet for immediately available ER MD   ? Location [facility]-Cardiac & Pulmonary Rehab   ? Staff Present Coralie Keens, MS, ASCM CEP, Exercise Physiologist;Jessica Clyman, MA, RCEP, CCRP, CCET;Kelly Bollinger, MPA, RN;Melissa [place], RDN, LDN   ? Virtual Visit No   ? Medication changes reported     No   ? Fall or balance concerns reported    No   ? Warm-up and Cool-down Not performed (comment)   6MWT and Gym Orientation  ? Resistance Training Performed No   ? VAD Patient? No   ? PAD/SET Patient? No   ? ?  ?  ? ?  ? ? ? ? 6 Minute Walk   ? ? [place] Name 10/14/21 1424  ?  ?  ?  ? 6 Minute Walk  ? Phase Initial    ? Distance 700 feet    ? Walk Time 6 minutes    ? # of Rest Breaks 0    ? MPH 1.32    ? METS 1.73    ? RPE 13    ? Perceived Dyspnea  1    ? VO2 Peak 6.05    ? Symptoms Yes (comment)    ? Comments SOB, Bilateral hip pain 8/10    ? Resting HR 72 bpm    ? Resting BP 118/70    ? Resting Oxygen Saturation  95 %    ? Exercise Oxygen Saturation  during 6 min walk 92 %    ? Max Ex. HR 130 bpm    ? Max Ex. BP 158/74    ? 2 Minute Post BP 124/72    ? ?  ?  ? ?  ?  ? ? Exercise Prescription Changes - 10/14/21 1400   ? ?  ? Response to Exercise  ? Blood Pressure (Admit) 118/70   ? Blood Pressure (Exercise) 158/74   ? Blood Pressure (Exit) 124/72   ? Heart Rate (Admit) 72 bpm   ? Heart Rate (Exercise) 130 bpm   ? Heart Rate (Exit) 79 bpm   ? Oxygen Saturation (Admit) 95 %   ? Oxygen Saturation (Exercise) 92 %   ? Oxygen Saturation (Exit) 95 %   ? Rating of  Perceived Exertion (Exercise) 13   ? Perceived Dyspnea (Exercise) 1   ? Symptoms SOB, Bilateral hip pain 8/10   ? Comments walk test results   ? ?  ?  ? ?  ? ? ?Social History  ? ?Tobacco Use  ?Smoking Status Former  ? Packs/day: 1.00  ? Years: 15.00  ? Pack years: 15.00  ? Types: Cigarettes  ? Quit date: 2002  ? Years since quitting: 21.3  ?Smokeless Tobacco Never  ?Tobacco Comments  ? quit 20 years ago  ? ? ?Goals Met:  ?Proper associated with RPD/PD & O2 Sat ?Exercise tolerated well ?Personal goals reviewed ?No report of concerns or symptoms today ? ?Goals Unmet:  ?Not Applicable ? ?Comments: First full day of orientation. All starting workloads were established based on  the results of the 6 minute walk test done at initial orientation visit.  The plan for exercise progression was also introduced and progression will be customized based on patient's performance and goals.  ? ? ?Dr. Emily Filbert is Medical Director for [place].  ?Dr. Ottie Glazier is Medical Director for [facility] Pulmonary Rehabilitation. ?

## 2021-10-21 ENCOUNTER — Encounter: Payer: Medicaid Other | Admitting: *Deleted

## 2021-10-21 DIAGNOSIS — Z170 Estrogen receptor positive status [ER+]: Secondary | ICD-10-CM | POA: Diagnosis present

## 2021-10-21 DIAGNOSIS — C50312 Malignant neoplasm of lower-inner quadrant of left female breast: Secondary | ICD-10-CM | POA: Diagnosis present

## 2021-10-21 LAB — GLUCOSE, CAPILLARY
Glucose-Capillary: 247 mg/dL — ABNORMAL HIGH (ref 70–99)
Glucose-Capillary: 227 mg/dL — ABNORMAL HIGH (ref 70–99)

## 2021-10-21 NOTE — Progress Notes (Signed)
Daily Session Note ? ?Patient Details  ?Name: Cheryl Silva ?MRN: [id number] ?Date of Birth: September 22, 1960 ?Referring Provider:   ?Flowsheet Row Cancer Associated Rehabilitation & Exercise from 10/14/2021 in [facility] Cardiac and Pulmonary Rehab  ?Referring Provider Charlaine Dalton MD  ? ?  ? ? ?Encounter Date: 10/21/2021 ? ?Check In: ? Session Check In - 10/21/21 1221   ? ?  ? Check-In  ? Supervising physician immediately available to respond to emergencies See telemetry face sheet for immediately available ER MD   ? Location [facility]-Cardiac & Pulmonary Rehab   ? Staff Present Alberteen Sam, MA, RCEP, CCRP, Marylynn Pearson, MS, ASCM CEP, Exercise Physiologist;Susanne Bice, RN, BSN, CCRP   ? Virtual Visit No   ? Medication changes reported     No   ? Fall or balance concerns reported    No   ? Warm-up and Cool-down Performed on first and last piece of equipment   ? Resistance Training Performed Yes   ? VAD Patient? No   ? PAD/SET Patient? No   ?  ? Pain Assessment  ? Currently in Pain? No/denies   ? ?  ?  ? ?  ? ? ? ? ? ?Social History  ? ?Tobacco Use  ?Smoking Status Former  ? Packs/day: 1.00  ? Years: 15.00  ? Pack years: 15.00  ? Types: Cigarettes  ? Quit date: 2002  ? Years since quitting: 21.3  ?Smokeless Tobacco Never  ?Tobacco Comments  ? quit 20 years ago  ? ? ?Goals Met:  ?Proper associated with RPD/PD & O2 Sat ?Using PLB without cueing & demonstrates good technique ?Exercise tolerated well ?Personal goals reviewed ?No report of concerns or symptoms today ?Strength training completed today ? ?Goals Unmet:  ?Not Applicable ? ?Comments: First full day of exercise!  Patient was oriented to gym and equipment including functions, settings, policies, and procedures.  Patient's individual exercise prescription and treatment plan were reviewed.  All starting workloads were established based on the results of the 6 minute walk test done at initial orientation visit.  The plan for exercise progression was also introduced and  progression will be customized based on patient's performance and goals. ? ? ? ?Dr. Emily Filbert is Medical Director for [place].  ?Dr. Ottie Glazier is Medical Director for [facility] Pulmonary Rehabilitation. ?

## 2021-10-28 DIAGNOSIS — Z170 Estrogen receptor positive status [ER+]: Secondary | ICD-10-CM

## 2021-10-28 DIAGNOSIS — C50312 Malignant neoplasm of lower-inner quadrant of left female breast: Secondary | ICD-10-CM | POA: Diagnosis not present

## 2021-10-28 LAB — GLUCOSE, CAPILLARY
Glucose-Capillary: 199 mg/dL — ABNORMAL HIGH (ref 70–99)
Glucose-Capillary: 173 mg/dL — ABNORMAL HIGH (ref 70–99)

## 2021-10-28 NOTE — Progress Notes (Signed)
Daily Session Note ? ?Patient Details  ?Name: Cheryl Silva ?MRN: [id number] ?Date of Birth: 1961-04-27 ?Referring Provider:   ?Flowsheet Row Cancer Associated Rehabilitation & Exercise from 10/14/2021 in [facility] Cardiac and Pulmonary Rehab  ?Referring Provider Charlaine Dalton MD  ? ?  ? ? ?Encounter Date: 10/28/2021 ? ?Check In: ? Session Check In - 10/28/21 1210   ? ?  ? Check-In  ? Supervising physician immediately available to respond to emergencies See telemetry face sheet for immediately available ER MD   ? Location [facility]-Cardiac & Pulmonary Rehab   ? Staff Present Birdie Sons, MPA, Nino Glow, MS, ASCM CEP, Exercise Physiologist;Melissa Chalfant, RDN, LDN;Susanne Bice, RN, BSN, CCRP;Jessica Hawkins, MA, RCEP, CCRP, CCET   ? Virtual Visit No   ? Medication changes reported     No   ? Fall or balance concerns reported    No   ? Warm-up and Cool-down Performed on first and last piece of equipment   ? Resistance Training Performed Yes   ? VAD Patient? No   ? PAD/SET Patient? No   ?  ? Pain Assessment  ? Currently in Pain? No/denies   ? ?  ?  ? ?  ? ? ? ? ? ?Social History  ? ?Tobacco Use  ?Smoking Status Former  ? Packs/day: 1.00  ? Years: 15.00  ? Pack years: 15.00  ? Types: Cigarettes  ? Quit date: 2002  ? Years since quitting: 21.3  ?Smokeless Tobacco Never  ?Tobacco Comments  ? quit 20 years ago  ? ? ?Goals Met:  ?Independence with exercise equipment ?Exercise tolerated well ?No report of concerns or symptoms today ?Strength training completed today ? ?Goals Unmet:  ?Not Applicable ? ?Comments: Pt able to follow exercise prescription today without complaint.  Will continue to monitor for progression. ? ? ? ?Dr. Emily Filbert is Medical Director for [place].  ?Dr. Ottie Glazier is Medical Director for [facility] Pulmonary Rehabilitation. ?

## 2021-11-13 ENCOUNTER — Encounter: Payer: Medicaid Other | Attending: Internal Medicine

## 2021-11-13 DIAGNOSIS — C50312 Malignant neoplasm of lower-inner quadrant of left female breast: Secondary | ICD-10-CM | POA: Insufficient documentation

## 2021-11-13 DIAGNOSIS — Z170 Estrogen receptor positive status [ER+]: Secondary | ICD-10-CM | POA: Insufficient documentation

## 2021-11-25 ENCOUNTER — Telehealth: Payer: Self-pay

## 2021-11-25 NOTE — Telephone Encounter (Signed)
Left message for patient regarding CARE. Has not attended since 5/16. Will discharge if no response by the end of the week.

## 2021-11-26 ENCOUNTER — Inpatient Hospital Stay: Payer: Medicaid Other | Attending: Internal Medicine | Admitting: Internal Medicine

## 2021-11-26 ENCOUNTER — Encounter: Payer: Self-pay | Admitting: Internal Medicine

## 2021-11-26 ENCOUNTER — Inpatient Hospital Stay: Payer: Medicaid Other

## 2021-11-26 DIAGNOSIS — N951 Menopausal and female climacteric states: Secondary | ICD-10-CM | POA: Insufficient documentation

## 2021-11-26 DIAGNOSIS — Z170 Estrogen receptor positive status [ER+]: Secondary | ICD-10-CM | POA: Insufficient documentation

## 2021-11-26 DIAGNOSIS — Z87891 Personal history of nicotine dependence: Secondary | ICD-10-CM | POA: Diagnosis not present

## 2021-11-26 DIAGNOSIS — Z79811 Long term (current) use of aromatase inhibitors: Secondary | ICD-10-CM | POA: Diagnosis not present

## 2021-11-26 DIAGNOSIS — E1165 Type 2 diabetes mellitus with hyperglycemia: Secondary | ICD-10-CM | POA: Insufficient documentation

## 2021-11-26 DIAGNOSIS — C50312 Malignant neoplasm of lower-inner quadrant of left female breast: Secondary | ICD-10-CM | POA: Diagnosis present

## 2021-11-26 DIAGNOSIS — Z9012 Acquired absence of left breast and nipple: Secondary | ICD-10-CM | POA: Insufficient documentation

## 2021-11-26 MED ORDER — ANASTROZOLE 1 MG PO TABS: 1.0000 mg | ORAL_TABLET | Freq: Every day | ORAL | 1 refills | Status: DC

## 2021-11-26 NOTE — Assessment & Plan Note (Addendum)
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      # Currently on anastrozole [mid Feb 2023]-tolerating with mild to moderate side effects of worsening fatigue/hot flashes.  Continue anastrozole.  New prescription sent.  #Hot flashes-grade 1-2 monitor for now.  Stable.  If worse would recommend Effexor.  # Screening for osteoprosis: FEB 2023- BMDT-score of 0.0. continue ca+vitD BID.   # fatigue: multifactorial- [poorly controlled DM; recent surgery; ] inability to complete CARE program.  However it seems to be improving.  Monitor.  #Diabetes/weight loss: Discussed as a diabetes is not well controlled recommend weight loss/cut down processed food.  Increase physical activity.  # DISPOSITION: # follow up in 4 month; MD;-no labs- Dr.B

## 2021-11-26 NOTE — Progress Notes (Signed)
[place] NOTE  Patient Care Team: Center, [facility] as PCP - General (General Practice) Rico Junker, RN as Registered Nurse [place], Alyssa Grove, RN (Inactive) as Registered Nurse Theodore Demark, RN (Inactive) as Oncology Nurse Navigator Cammie Sickle, MD as Consulting Physician (Internal Medicine) Bary Castilla Forest Gleason, MD as Consulting Physician (General Surgery)  CHIEF COMPLAINTS/PURPOSE OF CONSULTATION: Breast cancer  #  Oncology History Overview Note  DIAGNOSIS:  A. LEFT BREAST, 8:30; ULTRASOUND-GUIDED BIOPSY:  - INVASIVE MAMMARY CARCINOMA, NO SPECIAL TYPE.   Size of invasive carcinoma: 4 mm in this sample  Histologic grade of invasive carcinoma: Grade 2                       Glandular/tubular differentiation score: 3                       Nuclear pleomorphism score: 2                       Mitotic rate score: 1                       Total score: 6  Ductal carcinoma in situ: Not identified  Lymphovascular invasion: Not identified  PATHOLOGIC STAGE CLASSIFICATION (pTNM, AJCC 8th Edition):  TNM Descriptors: Not applicable  pT Category: pT1b  Regional Lymph Nodes Modifier:  pN Category: pN0  pM Category: Not applicable   SPECIAL STUDIES  Breast Biomarker Testing Performed on Previous Biopsy: [id number]  Estrogen Receptor (ER) Status: POSITIVE          Percentage of cells with nuclear positivity: 91-100%          Average intensity of staining: Strong   Progesterone Receptor (PgR) Status: POSITIVE          Percentage of cells with nuclear positivity: 81-90%          Average intensity of staining: Moderate   HER2 (by immunohistochemistry): NEGATIVE (Score 0)  Ki-67: Not performed  JAN 2023- pT1b:G-1 [s/p mastectomy- pt pref; Dr.Byrnett]; no Oncotype; no adjuvant radiation- FEB 2023-anastrozole    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  Accompanied with family.  Cheryl Silva 61 y.o.  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  Continues to complain of hot flashes.  However not significantly interfering with daily lifestyle.   Continues to complain of fatigue overall improved.  Denies any worsening joint pains.  Compliant with her anastrozole.  Review of Systems  Constitutional:  Negative for chills, diaphoresis, fever, malaise/fatigue and weight loss.  HENT:  Negative for nosebleeds and sore throat.   Eyes:  Negative for double vision.  Respiratory:  Negative for cough, hemoptysis, sputum production, shortness of breath and wheezing.   Cardiovascular:  Negative for chest pain, palpitations, orthopnea and leg swelling.  Gastrointestinal:  Negative for abdominal pain, blood in stool, constipation, diarrhea, heartburn, melena,  nausea and vomiting.  Genitourinary:  Negative for dysuria, frequency and urgency.  Musculoskeletal:  Positive for joint pain. Negative for back pain.  Skin: Negative.  Negative for itching and rash.  Neurological:  Negative for dizziness, tingling, focal weakness, weakness and headaches.  Endo/Heme/Allergies:  Does not bruise/bleed easily.  Psychiatric/Behavioral:  Negative for depression. The patient is not nervous/anxious and does not have insomnia.      MEDICAL HISTORY:  Past Medical History:  Diagnosis Date   Arthritis    Cancer ([place])    left breast   COPD (chronic obstructive pulmonary disease) (HCC)    Diabetes  mellitus without complication (HCC)    Dyspnea    Epilepsy ([place])    no siezures in 43 years   Pneumonia    possibly beginning of November 2022-pt states md did not do cxr but was treating it as pneumonia   Sleep apnea    could not tolerate cpap    SURGICAL HISTORY: Past Surgical History:  Procedure Laterality Date   ABDOMINAL HYSTERECTOMY     APPENDECTOMY     BREAST BIOPSY Right    benign 15 years ago   BREAST BIOPSY Left 05/05/2021   stereo bx-calcs/"RIBBON" clip-path pending   CHOLECYSTECTOMY     COLONOSCOPY     JOINT REPLACEMENT Bilateral    2005 and 2018 knees   SIMPLE MASTECTOMY WITH AXILLARY SENTINEL NODE BIOPSY Left 05/26/2021   Procedure: SIMPLE MASTECTOMY WITH AXILLARY SENTINEL NODE BIOPSY;  Surgeon: Robert Bellow, MD;  Location: [facility] ORS;  Service: General;  Laterality: Left;    SOCIAL HISTORY: Social History   Socioeconomic History   Marital status: Married    Spouse name: Not on file   Number of children: Not on file   Years of education: Not on file   Highest education level: Not on file  Occupational History   Not on file  Tobacco Use   Smoking status: Former    Packs/day: 1.00    Years: 15.00    Total pack years: 15.00    Types: Cigarettes    Quit date: 2002    Years since quitting: 21.4   Smokeless tobacco: Never   Tobacco comments:    quit 20 years ago  Vaping Use   Vaping Use: Never used  Substance and Sexual Activity   Alcohol use: Not Currently   Drug use: Not Currently   Sexual activity: Not Currently  Other Topics Concern   Not on file  Social History Narrative   Patient lives in home with husband. Children and husband will be there to help out.   Feels safe in her home.  Does not work outside of home.   Social Determinants of Health   Financial Resource Strain: Not on file  Food Insecurity: Not on file  Transportation Needs: Not on file  Physical Activity: Not on file  Stress: Not on file  Social Connections: Not on file   Intimate Partner Violence: Not on file    FAMILY HISTORY: Family History  Problem Relation Age of Onset   Breast cancer Maternal Aunt 72    ALLERGIES:  is allergic to vicodin [hydrocodone-acetaminophen].  MEDICATIONS:  Current Outpatient Medications  Medication Sig Dispense Refill   acetaminophen (TYLENOL) 500 MG tablet Take 1,000 mg by mouth every 6 (six) hours as needed for moderate pain.     albuterol (VENTOLIN HFA) 108 (90 Base) MCG/ACT inhaler Inhale 2 puffs into the lungs every 6 (six) hours as needed for wheezing or shortness  of breath.     atorvastatin (LIPITOR) 20 MG tablet Take 20 mg by mouth daily.     fluticasone (FLOVENT HFA) 220 MCG/ACT inhaler Inhale 2 puffs into the lungs 2 (two) times daily.     gabapentin (NEURONTIN) 300 MG capsule Take 300 mg by mouth at bedtime.     JARDIANCE 10 MG TABS tablet Take 25 mg by mouth daily.     loratadine (CLARITIN) 10 MG tablet Take 10 mg by mouth daily as needed for allergies.     sertraline (ZOLOFT) 100 MG tablet Take 150 mg by mouth at bedtime.     anastrozole (ARIMIDEX) 1 MG tablet Take 1 tablet (1 mg total) by mouth daily. 90 tablet 1   No current facility-administered medications for this visit.      Marland Kitchen  PHYSICAL EXAMINATION: ECOG PERFORMANCE STATUS: 0 - Asymptomatic  Vitals:   11/26/21 1307  BP: 117/76  Pulse: 82  Temp: 99.5 F (37.5 C)  SpO2: 95%   Filed Weights   11/26/21 1307  Weight: (!) 301 lb 6.4 oz (136.7 kg)   Left mastectomy.  Drain in place. Physical Exam Vitals and nursing note reviewed.  Constitutional:      Comments:     HENT:     Head: Normocephalic and atraumatic.     Mouth/Throat:     Mouth: Mucous membranes are moist.     Pharynx: No oropharyngeal exudate.  Eyes:     Pupils: Pupils are equal, round, and reactive to light.  Cardiovascular:     Rate and Rhythm: Normal rate and regular rhythm.  Pulmonary:     Effort: No respiratory distress.     Breath sounds: No wheezing.      Comments: Decreased breath sounds bilaterally at bases.  No wheeze or crackles Abdominal:     General: Bowel sounds are normal. There is no distension.     Palpations: Abdomen is soft. There is no mass.     Tenderness: There is no abdominal tenderness. There is no guarding or rebound.  Musculoskeletal:        General: No tenderness. Normal range of motion.     Cervical back: Normal range of motion and neck supple.  Skin:    General: Skin is warm.  Neurological:     Mental Status: She is alert and oriented to person, place, and time.  Psychiatric:        Mood and Affect: Affect normal.        Judgment: Judgment normal.      LABORATORY DATA:  I have reviewed the data as listed Lab Results  Component Value Date   WBC 6.1 05/21/2021   HGB 14.2 05/21/2021   HCT 43.0 05/21/2021   MCV 93.7 05/21/2021   PLT 227 05/21/2021   Recent Labs    05/21/21 1223  NA 139  K 3.8  CL 107  CO2 25  GLUCOSE 158*  BUN 20  CREATININE 0.59  CALCIUM 8.9  GFRNONAA >60    RADIOGRAPHIC STUDIES: I have personally reviewed the radiological images as listed and agreed with the findings in the report. No results found.  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      # Currently on anastrozole [mid Feb 2023]-tolerating with mild to moderate side effects of worsening fatigue/hot flashes.  Continue anastrozole.  New prescription sent.  #Hot  flashes-grade 1-2 monitor for now.  Stable.  If worse would recommend Effexor.  # Screening for osteoprosis: FEB 2023- BMDT-score of 0.0. continue ca+vitD BID.   # fatigue: multifactorial- [poorly controlled DM; recent surgery; ] inability to complete CARE program.  However it seems to be improving.  Monitor.  #Diabetes/weight loss: Discussed as a diabetes is not well controlled recommend weight  loss/cut down processed food.  Increase physical activity.  # DISPOSITION: # follow up in 4 month; MD;-no labs- Dr.B  All questions were answered. The patient/family knows to call the clinic with any problems, questions or concerns.    Cammie Sickle, MD 11/26/2021 1:50 PM

## 2021-12-08 DIAGNOSIS — Z170 Estrogen receptor positive status [ER+]: Secondary | ICD-10-CM

## 2022-01-24 ENCOUNTER — Emergency Department (HOSPITAL_COMMUNITY): Payer: Medicaid Other

## 2022-01-24 ENCOUNTER — Other Ambulatory Visit: Payer: Self-pay

## 2022-01-24 ENCOUNTER — Encounter (HOSPITAL_COMMUNITY): Payer: Self-pay

## 2022-01-24 ENCOUNTER — Emergency Department (HOSPITAL_COMMUNITY)
Admission: EM | Admit: 2022-01-24 | Discharge: 2022-01-24 | Disposition: A | Discharge status: Home / Self Care | Payer: Medicaid Other | Attending: Emergency Medicine | Admitting: Emergency Medicine

## 2022-01-24 DIAGNOSIS — S0992XA Unspecified injury of nose, initial encounter: Secondary | ICD-10-CM | POA: Diagnosis present

## 2022-01-24 DIAGNOSIS — Z853 Personal history of malignant neoplasm of breast: Secondary | ICD-10-CM | POA: Insufficient documentation

## 2022-01-24 DIAGNOSIS — Z7951 Long term (current) use of inhaled steroids: Secondary | ICD-10-CM | POA: Diagnosis not present

## 2022-01-24 DIAGNOSIS — J449 Chronic obstructive pulmonary disease, unspecified: Secondary | ICD-10-CM | POA: Diagnosis not present

## 2022-01-24 DIAGNOSIS — E119 Type 2 diabetes mellitus without complications: Secondary | ICD-10-CM | POA: Diagnosis not present

## 2022-01-24 DIAGNOSIS — W19XXXA Unspecified fall, initial encounter: Secondary | ICD-10-CM | POA: Diagnosis not present

## 2022-01-24 DIAGNOSIS — Z79899 Other long term (current) drug therapy: Secondary | ICD-10-CM | POA: Diagnosis not present

## 2022-01-24 DIAGNOSIS — R519 Headache, unspecified: Secondary | ICD-10-CM | POA: Diagnosis not present

## 2022-01-24 DIAGNOSIS — S0031XA Abrasion of nose, initial encounter: Secondary | ICD-10-CM | POA: Diagnosis not present

## 2022-01-24 LAB — CBC
HCT: 40.9 % (ref 36.0–46.0)
Hemoglobin: 13.7 g/dL (ref 12.0–15.0)
MCH: 31 pg (ref 26.0–34.0)
MCHC: 33.5 g/dL (ref 30.0–36.0)
MCV: 92.5 fL (ref 80.0–100.0)
Platelets: 201 10*3/uL (ref 150–400)
RBC: 4.42 MIL/uL (ref 3.87–5.11)
RDW: 12.3 % (ref 11.5–15.5)
WBC: 6.4 10*3/uL (ref 4.0–10.5)
nRBC: 0 % (ref 0.0–0.2)

## 2022-01-24 LAB — BASIC METABOLIC PANEL
Anion gap: 8 (ref 5–15)
BUN: 19 mg/dL (ref 6–20)
CO2: 23 mmol/L (ref 22–32)
Calcium: 8.9 mg/dL (ref 8.9–10.3)
Chloride: 107 mmol/L (ref 98–111)
Creatinine, Ser: 0.78 mg/dL (ref 0.44–1.00)
GFR, Estimated: >60 mL/min (ref >60)
Glucose, Bld: 298 mg/dL — ABNORMAL HIGH (ref 70–99)
Potassium: 3.7 mmol/L (ref 3.5–5.1)
Sodium: 138 mmol/L (ref 135–145)

## 2022-01-24 MED ORDER — OXYCODONE HCL 5 MG PO TABS
5.0000 mg | ORAL_TABLET | ORAL | Status: AC
  Administered 2022-01-24: 5 mg via ORAL
  Filled 2022-01-24: qty 1

## 2022-01-24 MED ORDER — IBUPROFEN 800 MG PO TABS
800.0000 mg | ORAL_TABLET | Freq: Once | ORAL | Status: AC
  Administered 2022-01-24: 800 mg via ORAL
  Filled 2022-01-24: qty 1

## 2022-01-24 NOTE — Discharge Instructions (Addendum)
Please follow-up with your PCP for further management Please return to the ED if any new symptoms such as unilateral weakness or numbness Please read the attached guide concerning falls

## 2022-01-24 NOTE — ED Provider Notes (Cosign Needed Addendum)
[facility] EMERGENCY DEPARTMENT Provider Note   CSN: [id number] Arrival date & time: 01/24/22  1606     History  Chief Complaint  Patient presents with   Cheryl Silva is a 61 y.o. female with medical history of COPD, diabetes, epilepsy, breast cancer.  Patient presents to ED for evaluation of fall.  Patient states that prior to arrival she was walking in her yard when her right leg stepped in a hole causing her to fall forward onto her face.  Patient denies loss of consciousness, denies blood thinners.  Patient states that now she has pain about her head rated as 10 out of 10 as well as pain to her nose.  The patient does have what appears to be a superficial abrasion to the bridge of her nose.  Patient denies any nausea, vomiting, lightheadedness, dizziness, weakness, light sensitivity, neck pain, back pain.  Patient denies any right-sided ankle pain.   Fall Associated symptoms include headaches.       Home Medications Prior to Admission medications   Medication Sig Start Date End Date Taking? Authorizing Provider  acetaminophen (TYLENOL) 500 MG tablet Take 1,000 mg by mouth every 6 (six) hours as needed for moderate pain.    [provider]  albuterol (VENTOLIN HFA) 108 (90 Base) MCG/ACT inhaler Inhale 2 puffs into the lungs every 6 (six) hours as needed for wheezing or shortness of breath.    [provider]  anastrozole (ARIMIDEX) 1 MG tablet Take 1 tablet (1 mg total) by mouth daily. 11/26/21   Cammie Sickle, MD  atorvastatin (LIPITOR) 20 MG tablet Take 20 mg by mouth daily. 08/20/21   [provider]  fluticasone (FLOVENT HFA) 220 MCG/ACT inhaler Inhale 2 puffs into the lungs 2 (two) times daily.    [provider]  gabapentin (NEURONTIN) 300 MG capsule Take 300 mg by mouth at bedtime. 03/21/19   [provider]  JARDIANCE 10 MG TABS tablet Take 25 mg by mouth daily. 05/24/20   [provider]  loratadine  (CLARITIN) 10 MG tablet Take 10 mg by mouth daily as needed for allergies.    [provider]  sertraline (ZOLOFT) 100 MG tablet Take 150 mg by mouth at bedtime.    [provider]      Allergies    Vicodin [hydrocodone-acetaminophen]    Review of Systems   Review of Systems  HENT:         Facial pain  Eyes:  Negative for photophobia.  Gastrointestinal:  Negative for nausea and vomiting.  Musculoskeletal:  Negative for arthralgias, back pain, myalgias and neck pain.  Neurological:  Positive for headaches. Negative for dizziness, syncope, weakness and light-headedness.  All other systems reviewed and are negative.   Physical Exam Updated Vital Signs BP (!) 143/76   Pulse 81   Temp 98 F (36.7 C) (Oral)   Resp 17   Ht '5\' 4"'$  (1.626 m)   Wt 133.8 kg   SpO2 96%   BMI 50.64 kg/m  Physical Exam Vitals and nursing note reviewed.  Constitutional:      General: She is not in acute distress.    Appearance: Normal appearance. She is not ill-appearing, toxic-appearing or diaphoretic.  HENT:     Head: Normocephalic and atraumatic.      Nose: Nose normal. No congestion.     Mouth/Throat:     Mouth: Mucous membranes are moist.     Pharynx: Oropharynx is clear.  Eyes:     Extraocular Movements: Extraocular movements intact.     Conjunctiva/sclera: Conjunctivae normal.     Pupils: Pupils are equal, round, and reactive to light.  Cardiovascular:     Rate and Rhythm: Normal rate and regular rhythm.  Pulmonary:     Effort: Pulmonary effort is normal.     Breath sounds: Normal breath sounds. No wheezing.  Abdominal:     General: Abdomen is flat. Bowel sounds are normal.     Palpations: Abdomen is soft.     Tenderness: There is no abdominal tenderness.  Musculoskeletal:     Cervical back: Normal range of motion and neck supple. No rigidity or tenderness.  Skin:    General: Skin is warm and dry.     Capillary Refill: Capillary refill takes less than 2 seconds.   Neurological:     General: No focal deficit present.     Mental Status: She is alert and oriented to person, place, and time.     GCS: GCS eye subscore is 4. GCS verbal subscore is 5. GCS motor subscore is 6.     Cranial Nerves: Cranial nerves 2-12 are intact. No cranial nerve deficit.     Sensory: Sensation is intact. No sensory deficit.     Motor: No weakness.     Coordination: Coordination is intact. Heel to [facility] Test normal.     Comments: Patient cranial nerves II through XII intact.  The patient has equal strength and sensation to upper and lower extremities bilaterally.  5 out of 5 strength upper and lower extremities bilaterally.  Patient follows commands appropriately.  Intact heel-to-shin, intact finger-to-nose.  Pupils PERRL.     ED Results / Procedures / Treatments   Labs (all labs ordered are listed, but only abnormal results are displayed) Labs Reviewed  BASIC METABOLIC PANEL - Abnormal; Notable for the following components:      Result Value   Glucose, Bld 298 (*)    All other components within normal limits  CBC    EKG None  Radiology CT Head Wo Contrast  Result Date: 01/24/2022 CLINICAL DATA:  Head trauma, moderate-severe; Facial trauma, blunt EXAM: CT HEAD WITHOUT CONTRAST CT MAXILLOFACIAL WITHOUT CONTRAST TECHNIQUE: Multidetector CT imaging of the head and maxillofacial structures were performed using the standard protocol without intravenous contrast. Multiplanar CT image reconstructions of the maxillofacial structures were also generated. RADIATION DOSE REDUCTION: This exam was performed according to the departmental dose-optimization program which includes automated exposure control, adjustment of the mA and/or kV according to patient size and/or use of iterative reconstruction technique. COMPARISON:  None Available. FINDINGS: CT HEAD FINDINGS Brain: No evidence of acute infarction, hemorrhage, hydrocephalus, extra-axial collection or mass lesion/mass effect.  Vascular: No hyperdense vessel or unexpected calcification. Skull: Normal. Negative for fracture or focal lesion. Other: None. CT MAXILLOFACIAL FINDINGS Osseous: No acute maxillofacial bone fracture. Bony orbital walls are intact. Mandible intact. Temporomandibular joints are aligned without dislocation. Orbits: Negative. No traumatic or inflammatory finding. Sinuses: Minimal mucosal thickening along the inferior left maxillary sinus. Otherwise clear. Soft tissues: Mild soft tissue swelling in the inferior left frontal forehead region. No focal hematoma. IMPRESSION: 1. No acute intracranial findings. 2. No acute maxillofacial bone fracture. Electronically Signed   By: Davina Poke D.O.   On: 01/24/2022 17:15   CT Maxillofacial Wo Contrast  Result Date: 01/24/2022 CLINICAL DATA:  Head trauma, moderate-severe; Facial trauma, blunt EXAM: CT HEAD WITHOUT CONTRAST CT MAXILLOFACIAL WITHOUT CONTRAST TECHNIQUE: Multidetector CT imaging of the  head and maxillofacial structures were performed using the standard protocol without intravenous contrast. Multiplanar CT image reconstructions of the maxillofacial structures were also generated. RADIATION DOSE REDUCTION: This exam was performed according to the departmental dose-optimization program which includes automated exposure control, adjustment of the mA and/or kV according to patient size and/or use of iterative reconstruction technique. COMPARISON:  None Available. FINDINGS: CT HEAD FINDINGS Brain: No evidence of acute infarction, hemorrhage, hydrocephalus, extra-axial collection or mass lesion/mass effect. Vascular: No hyperdense vessel or unexpected calcification. Skull: Normal. Negative for fracture or focal lesion. Other: None. CT MAXILLOFACIAL FINDINGS Osseous: No acute maxillofacial bone fracture. Bony orbital walls are intact. Mandible intact. Temporomandibular joints are aligned without dislocation. Orbits: Negative. No traumatic or inflammatory finding.  Sinuses: Minimal mucosal thickening along the inferior left maxillary sinus. Otherwise clear. Soft tissues: Mild soft tissue swelling in the inferior left frontal forehead region. No focal hematoma. IMPRESSION: 1. No acute intracranial findings. 2. No acute maxillofacial bone fracture. Electronically Signed   By: Davina Poke D.O.   On: 01/24/2022 17:15    Procedures Procedures   Medications Ordered in ED Medications  oxyCODONE (Oxy IR/ROXICODONE) immediate release tablet 5 mg (has no administration in time range)  ibuprofen (ADVIL) tablet 800 mg (800 mg Oral Given 01/24/22 1717)    ED Course/ Medical Decision Making/ A&P                           Medical Decision Making Amount and/or Complexity of Data Reviewed Labs: ordered. Radiology: ordered.  Risk Prescription drug management.   61 year old female presents to ED for evaluation.  Please see HPI for further details.  On examination, the patient is afebrile and nontachycardic.  The patient lung sounds are clear bilaterally, she is not hypoxic on room air.  Patient abdomen is soft and compressible in all 4 quadrants.  The patient neurological examination shows no focal neurodeficits.  The patient is nontoxic in appearance.  The patient is a superficial abrasion to the bridge of her nose however there is no laceration in need of repair.  This area was covered with petroleum impregnated gauze. Patient reports last tetanus shot 3 years ago.  Patient worked up utilizing the following labs and imaging studies interpreted by me personally: - CBC unremarkable - BMP with elevated glucose to 298 - CT head shows no intracranial abnormality - CT maxillofacial shows no nasal fracture, fracture of the facial structures  Patient treated with 800 mg ibuprofen.  Patient requesting pain medication however I advised the patient that due to her already falling, I am apprehensive to giving her pain medication which would increase her chances of  falling again.  I advised the patient that the best analgesic effect has been shown to be maintained or achieved utilizing Tylenol and alternating with ibuprofen.  The patient voices understanding of these instructions.  The patient will be discharged home and advised to follow-up with her PCP for further management.  The patient has been given return precautions and she has voiced understanding of these.  The patient has had all of her questions answered to her satisfaction.  The patient is stable at this time for discharge home.  Addendum: Prior to discharge, I went to go and dressed this patient's wound.  The patient's family became upset with me that I was not providing the patient with pain medication to go home with.  I advised the patient and her family that she does not have  anything broken, she is resting comfortably in bed, pain medication increases the risks of falls which is why the patient is here to begin with.  Patient and family began explaining that "they are not drug addicts" and they do not know why they are not being allowed to be prescribed pain medication.  I again continued to explain that this patient does not have anything broken, she has been provided with ibuprofen and the source of her pain is from swelling.  The family continued to state that I was not treating the patient's pain.  I compromised with the family, 1 dosage of 5 mg oxycodone was provided.  The patient is discharged in stable condition.  Final Clinical Impression(s) / ED Diagnoses Final diagnoses:  Fall, initial encounter    Rx / DC Orders ED Discharge Orders     None             Lawana Chambers 01/24/22 Imagene Gurney, MD 01/26/22 [phone]

## 2022-01-24 NOTE — ED Triage Notes (Signed)
Pt fell in her yard and landed on her face. Open area in between her eyes of open skin.

## 2022-01-24 NOTE — ED Notes (Signed)
Pt ambulated to bathroom without any issues.

## 2022-02-23 ENCOUNTER — Other Ambulatory Visit: Payer: Self-pay | Admitting: General Surgery

## 2022-02-23 DIAGNOSIS — C50912 Malignant neoplasm of unspecified site of left female breast: Secondary | ICD-10-CM

## 2022-03-25 ENCOUNTER — Encounter: Payer: Self-pay | Admitting: Internal Medicine

## 2022-03-25 ENCOUNTER — Inpatient Hospital Stay: Payer: Medicaid Other | Attending: Internal Medicine | Admitting: Internal Medicine

## 2022-03-25 DIAGNOSIS — C50312 Malignant neoplasm of lower-inner quadrant of left female breast: Secondary | ICD-10-CM | POA: Diagnosis present

## 2022-03-25 DIAGNOSIS — Z170 Estrogen receptor positive status [ER+]: Secondary | ICD-10-CM | POA: Diagnosis not present

## 2022-03-25 DIAGNOSIS — E1165 Type 2 diabetes mellitus with hyperglycemia: Secondary | ICD-10-CM | POA: Diagnosis not present

## 2022-03-25 MED ORDER — EXEMESTANE 25 MG PO TABS: 25.0000 mg | ORAL_TABLET | Freq: Every day | ORAL | 4 refills | Status: DC

## 2022-03-25 NOTE — Progress Notes (Signed)
Patient here today for follow up regarding breast cancer. Patient reports she stopped taking anastrozole about 2 months ago due to fatigue and hot flashes. Patient reports symptoms resolved after stopping anastrozole.

## 2022-03-25 NOTE — Progress Notes (Signed)
one [place] NOTE  Patient Care Team: Center, [facility] as PCP - General (General Practice) Rico Junker, RN as Registered Nurse [place] Carbo, Alyssa Grove, RN (Inactive) as Registered Nurse Theodore Demark, RN (Inactive) as Oncology Nurse Navigator Cammie Sickle, MD as Consulting Physician (Internal Medicine) Bary Castilla Forest Gleason, MD as Consulting Physician (General Surgery)  CHIEF COMPLAINTS/PURPOSE OF CONSULTATION: Breast cancer  #  Oncology History Overview Note  DIAGNOSIS:  A. LEFT BREAST, 8:30; ULTRASOUND-GUIDED BIOPSY:  - INVASIVE MAMMARY CARCINOMA, NO SPECIAL TYPE.   Size of invasive carcinoma: 4 mm in this sample  Histologic grade of invasive carcinoma: Grade 2                       Glandular/tubular differentiation score: 3                       Nuclear pleomorphism score: 2                       Mitotic rate score: 1                       Total score: 6  Ductal carcinoma in situ: Not identified  Lymphovascular invasion: Not identified  PATHOLOGIC STAGE CLASSIFICATION (pTNM, AJCC 8th Edition):  TNM Descriptors: Not applicable  pT Category: pT1b  Regional Lymph Nodes Modifier:  pN Category: pN0  pM Category: Not applicable   SPECIAL STUDIES  Breast Biomarker Testing Performed on Previous Biopsy: [id number]  Estrogen Receptor (ER) Status: POSITIVE          Percentage of cells with nuclear positivity: 91-100%          Average intensity of staining: Strong   Progesterone Receptor (PgR) Status: POSITIVE          Percentage of cells with nuclear positivity: 81-90%          Average intensity of staining: Moderate   HER2 (by immunohistochemistry): NEGATIVE (Score 0)  Ki-67: Not performed  JAN 2023- pT1b:G-1 [s/p mastectomy- pt pref; Dr.Byrnett]; no Oncotype; no adjuvant radiation- FEB 2023-anastrozole; STOPPED in SEP SEP 2023-severe hot flashes see/fatigue. 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  Alone.   Cheryl Silva 61 y.o.  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  Patient reports she stopped taking anastrozole about 2 months ago due to fatigue and hot flashes. No worsening joint pains. Patient reports symptoms resolved after stopping anastrozole.    Review of Systems  Constitutional:  Negative for chills, diaphoresis, fever, malaise/fatigue and weight loss.  HENT:  Negative for nosebleeds and sore throat.   Eyes:  Negative for double vision.  Respiratory:  Negative for cough, hemoptysis, sputum production, shortness of breath and wheezing.   Cardiovascular:  Negative for chest pain, palpitations, orthopnea and leg swelling.  Gastrointestinal:  Negative for abdominal pain, blood in  stool, constipation, diarrhea, heartburn, melena, nausea and vomiting.  Genitourinary:  Negative for dysuria, frequency and urgency.  Musculoskeletal:  Positive for joint pain. Negative for back pain.  Skin: Negative.  Negative for itching and rash.  Neurological:  Negative for dizziness, tingling, focal weakness, weakness and headaches.  Endo/Heme/Allergies:  Does not bruise/bleed easily.  Psychiatric/Behavioral:  Negative for depression. The patient is not nervous/anxious and does not have insomnia.      MEDICAL HISTORY:  Past Medical History:  Diagnosis Date   Arthritis    Cancer ([place])    left breast   COPD (chronic obstructive  pulmonary disease) (HCC)    Diabetes mellitus without complication (HCC)    Dyspnea    Epilepsy ([place])    no siezures in 43 years   Pneumonia    possibly beginning of November 2022-pt states md did not do cxr but was treating it as pneumonia   Sleep apnea    could not tolerate cpap    SURGICAL HISTORY: Past Surgical History:  Procedure Laterality Date   ABDOMINAL HYSTERECTOMY     APPENDECTOMY     BREAST BIOPSY Right    benign 15 years ago   BREAST BIOPSY Left 05/05/2021   stereo bx-calcs/"RIBBON" clip-path pending   CHOLECYSTECTOMY     COLONOSCOPY     JOINT REPLACEMENT Bilateral    2005 and 2018 knees   SIMPLE MASTECTOMY WITH AXILLARY SENTINEL NODE BIOPSY Left 05/26/2021   Procedure: SIMPLE MASTECTOMY WITH AXILLARY SENTINEL NODE BIOPSY;  Surgeon: Robert Bellow, MD;  Location: [facility] ORS;  Service: General;  Laterality: Left;    SOCIAL HISTORY: Social History   Socioeconomic History   Marital status: Married    Spouse name: Not on file   Number of children: Not on file   Years of education: Not on file   Highest education level: Not on file  Occupational History   Not on file  Tobacco Use   Smoking status: Former    Packs/day: 1.00    Years: 15.00    Total pack years: 15.00    Types: Cigarettes    Quit date: 2002    Years since quitting: 21.7   Smokeless tobacco: Never   Tobacco comments:    quit 20 years ago  Vaping Use   Vaping Use: Never used  Substance and Sexual Activity   Alcohol use: Not Currently   Drug use: Not Currently   Sexual activity: Not Currently  Other Topics Concern   Not on file  Social History Narrative   Patient lives in home with husband. Children and husband will be there to help out.   Feels safe in her home.  Does not work outside of home.   Social Determinants of Health   Financial Resource Strain: Not on file  Food Insecurity: Not on file  Transportation Needs: Not on file  Physical Activity: Not on file  Stress: Not on  file  Social Connections: Not on file  Intimate Partner Violence: Not on file    FAMILY HISTORY: Family History  Problem Relation Age of Onset   Breast cancer Maternal Aunt 48    ALLERGIES:  is allergic to vicodin [hydrocodone-acetaminophen].  MEDICATIONS:  Current Outpatient Medications  Medication Sig Dispense Refill   acetaminophen (TYLENOL) 500 MG tablet Take 1,000 mg by mouth every 6 (six) hours as needed for moderate pain.     albuterol (VENTOLIN HFA) 108 (90 Base) MCG/ACT inhaler Inhale 2 puffs into the lungs every 6 (six) hours as  needed for wheezing or shortness of breath.     atorvastatin (LIPITOR) 20 MG tablet Take 20 mg by mouth daily.     exemestane (AROMASIN) 25 MG tablet Take 1 tablet (25 mg total) by mouth daily after breakfast. 30 tablet 4   fluticasone (FLOVENT HFA) 220 MCG/ACT inhaler Inhale 2 puffs into the lungs 2 (two) times daily.     gabapentin (NEURONTIN) 300 MG capsule Take 300 mg by mouth at bedtime.     JARDIANCE 10 MG TABS tablet Take 25 mg by mouth daily.     loratadine (CLARITIN) 10 MG tablet Take 10 mg by mouth daily as needed for allergies.     Semaglutide,0.25 or 0.5MG/DOS, (OZEMPIC, 0.25 OR 0.5 MG/DOSE,) 2 MG/1.5ML SOPN Inject into the skin once a week.     sertraline (ZOLOFT) 100 MG tablet Take 150 mg by mouth at bedtime.     No current facility-administered medications for this visit.      Marland Kitchen  PHYSICAL EXAMINATION: ECOG PERFORMANCE STATUS: 0 - Asymptomatic  Vitals:   03/25/22 1332  Pulse: 67  Resp: 18  Temp: (!) 97.4 F (36.3 C)  SpO2: 97%   Filed Weights   03/25/22 1332  Weight: (!) 300 lb 12.8 oz (136.4 kg)   Left mastectomy.  Drain in place. Physical Exam Vitals and nursing note reviewed.  Constitutional:      Comments:     HENT:     Head: Normocephalic and atraumatic.     Mouth/Throat:     Mouth: Mucous membranes are moist.     Pharynx: No oropharyngeal exudate.  Eyes:     Pupils: Pupils are equal, round, and  reactive to light.  Cardiovascular:     Rate and Rhythm: Normal rate and regular rhythm.  Pulmonary:     Effort: No respiratory distress.     Breath sounds: No wheezing.     Comments: Decreased breath sounds bilaterally at bases.  No wheeze or crackles Abdominal:     General: Bowel sounds are normal. There is no distension.     Palpations: Abdomen is soft. There is no mass.     Tenderness: There is no abdominal tenderness. There is no guarding or rebound.  Musculoskeletal:        General: No tenderness. Normal range of motion.     Cervical back: Normal range of motion and neck supple.  Skin:    General: Skin is warm.  Neurological:     Mental Status: She is alert and oriented to person, place, and time.  Psychiatric:        Mood and Affect: Affect normal.        Judgment: Judgment normal.      LABORATORY DATA:  I have reviewed the data as listed Lab Results  Component Value Date   WBC 6.4 01/24/2022   HGB 13.7 01/24/2022   HCT 40.9 01/24/2022   MCV 92.5 01/24/2022   PLT 201 01/24/2022   Recent Labs    05/21/21 1223 01/24/22 1636  NA 139 138  K 3.8 3.7  CL 107 107  CO2 25 23  GLUCOSE 158* 298*  BUN 20 19  CREATININE 0.59 0.78  CALCIUM 8.9 8.9  GFRNONAA >60 >60    RADIOGRAPHIC STUDIES: I have personally reviewed the radiological images as listed and agreed with the findings in the report. No results found.  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      #Tolerated anastrozole poorly.  Symptoms of fatigue/hot flashes resolved after stopping anastrozole.  I would recommend switch to Aromasin.  Again reviewed the potential side effects including but not limited to hot flashes fatigue osteoporosis.  Prescription sent.  Monitor for now.   # Hot flashes-grade -improved- Stable.  If  worse would recommend Effexor.  # Screening for osteoprosis: FEB 2023- BMDT-score of 0.0. continue ca+vitD BID.   # fatigue: multifactorial- [poorly controlled DM; recent surgery]   #Diabetes/weight loss:on Ozempic weekly; no interactions.   # DISPOSITION: # follow up in 2 month; MD;-no labs- Dr.B  All questions were answered. The patient/family knows to call the clinic with any problems, questions or concerns.    Cammie Sickle, MD 03/25/2022 1:53 PM

## 2022-03-25 NOTE — Assessment & Plan Note (Addendum)
#  JAN 2023-pT1b G-1;  8 mm-invasive ductal cancer-ER/PR positive HER2 negative- s/p mastectomy [Dr.Byrnett].      #Tolerated anastrozole poorly.  Symptoms of fatigue/hot flashes resolved after stopping anastrozole.  I would recommend switch to Aromasin.  Again reviewed the potential side effects including but not limited to hot flashes fatigue osteoporosis.  Prescription sent.  Monitor for now.   # Hot flashes-grade -improved- Stable.  If worse would recommend Effexor.  # Screening for osteoprosis: FEB 2023- BMDT-score of 0.0. continue ca+vitD BID.   # fatigue: multifactorial- [poorly controlled DM; recent surgery]   #Diabetes/weight loss:on Ozempic weekly; no interactions.   # DISPOSITION: # follow up in 2 month; MD;-no labs- Dr.B

## 2022-04-03 ENCOUNTER — Ambulatory Visit
Admission: RE | Admit: 2022-04-03 | Discharge: 2022-04-03 | Disposition: A | Discharge status: Home / Self Care | Payer: Medicaid Other | Source: Ambulatory Visit | Attending: General Surgery | Admitting: General Surgery

## 2022-04-03 DIAGNOSIS — Z1231 Encounter for screening mammogram for malignant neoplasm of breast: Secondary | ICD-10-CM | POA: Insufficient documentation

## 2022-04-03 DIAGNOSIS — C50912 Malignant neoplasm of unspecified site of left female breast: Secondary | ICD-10-CM | POA: Insufficient documentation

## 2022-04-03 DIAGNOSIS — Z170 Estrogen receptor positive status [ER+]: Secondary | ICD-10-CM | POA: Diagnosis present

## 2022-05-19 ENCOUNTER — Inpatient Hospital Stay: Payer: Medicaid Other | Attending: Internal Medicine | Admitting: Internal Medicine

## 2022-05-19 VITALS — BP 140/82 | HR 73 | Temp 100.0°F | Resp 16 | Wt 308.5 lb

## 2022-05-19 DIAGNOSIS — E119 Type 2 diabetes mellitus without complications: Secondary | ICD-10-CM | POA: Insufficient documentation

## 2022-05-19 DIAGNOSIS — Z170 Estrogen receptor positive status [ER+]: Secondary | ICD-10-CM | POA: Diagnosis not present

## 2022-05-19 DIAGNOSIS — E785 Hyperlipidemia, unspecified: Secondary | ICD-10-CM | POA: Insufficient documentation

## 2022-05-19 DIAGNOSIS — C50312 Malignant neoplasm of lower-inner quadrant of left female breast: Secondary | ICD-10-CM | POA: Insufficient documentation

## 2022-05-19 DIAGNOSIS — Z79811 Long term (current) use of aromatase inhibitors: Secondary | ICD-10-CM | POA: Insufficient documentation

## 2022-05-19 MED ORDER — EXEMESTANE 25 MG PO TABS: 25.0000 mg | ORAL_TABLET | Freq: Every day | ORAL | 1 refills | Status: DC

## 2022-05-19 NOTE — Assessment & Plan Note (Addendum)
#  JAN 2023-pT1b G-1;  8 mm-invasive ductal cancer-ER/PR positive HER2 negative- s/p mastectomy [Dr.Byrnett]. Currently on Aromasin-tolerating Aromasin well.  Prescription refilled for 90 days.  UNI MAMMOGRAM- oct 2023- WNL>   # Hot flashes-grade -improved- STABLE.  If worse would recommend Effexor.  # Screening for osteoprosis: FEB 2023- BMDT-score of 0.0. continue ca+vitD BID. STABLE.   # Diabetes/weight loss:on Ozempic weekly; weight gain 8 pounds in 2 months [Charles Drew]; recommend dietary discretion.   Worse-defer to PCP.  # DISPOSITION: # follow up in 6 month; MD; labs- cbc/cmp- Dr.B

## 2022-05-19 NOTE — Progress Notes (Signed)
Patient denies new problems/concerns today.    Hot flashes and fatigue more tolerable on Aromasin.  Does have an 8 lb wt loss since

## 2022-05-19 NOTE — Progress Notes (Signed)
one [place] NOTE  Patient Care Team: Center, [facility] as PCP - General (General Practice) Rico Junker, RN as Registered Nurse [place], Alyssa Grove, RN (Inactive) as Registered Nurse Theodore Demark, RN (Inactive) as Oncology Nurse Navigator Cammie Sickle, MD as Consulting Physician (Internal Medicine) Bary Castilla Forest Gleason, MD as Consulting Physician (General Surgery)  CHIEF COMPLAINTS/PURPOSE OF CONSULTATION: Breast cancer  #  Oncology History Overview Note  DIAGNOSIS:  A. LEFT BREAST, 8:30; ULTRASOUND-GUIDED BIOPSY:  - INVASIVE MAMMARY CARCINOMA, NO SPECIAL TYPE.   Size of invasive carcinoma: 4 mm in this sample  Histologic grade of invasive carcinoma: Grade 2                       Glandular/tubular differentiation score: 3                       Nuclear pleomorphism score: 2                       Mitotic rate score: 1                       Total score: 6  Ductal carcinoma in situ: Not identified  Lymphovascular invasion: Not identified  PATHOLOGIC STAGE CLASSIFICATION (pTNM, AJCC 8th Edition):  TNM Descriptors: Not applicable  pT Category: pT1b  Regional Lymph Nodes Modifier:  pN Category: pN0  pM Category: Not applicable   SPECIAL STUDIES  Breast Biomarker Testing Performed on Previous Biopsy: [id number]  Estrogen Receptor (ER) Status: POSITIVE          Percentage of cells with nuclear positivity: 91-100%          Average intensity of staining: Strong   Progesterone Receptor (PgR) Status: POSITIVE          Percentage of cells with nuclear positivity: 81-90%          Average intensity of staining: Moderate   HER2 (by immunohistochemistry): NEGATIVE (Score 0)  Ki-67: Not performed  JAN 2023- pT1b:G-1 [s/p mastectomy- pt pref; Dr.Byrnett]; no Oncotype; no adjuvant radiation- FEB 2023-anastrozole; STOPPED in SEP SEP 2023-severe hot flashes see/fatigue. 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  With friend.   Darius Bump 61 y.o.  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  Patient is currently on Ozempic.  However is gained 8 pounds in the last 2 months.  Does admit to dietary indiscretion.  Patient denies new problems/concerns today.   Patient continues to have hot flashes and fatigue.  However symptoms are tolerable on Aromasin.    Review of Systems  Constitutional:  Negative for chills, diaphoresis, fever, malaise/fatigue and weight loss.  HENT:  Negative for nosebleeds and sore throat.   Eyes:  Negative for double vision.  Respiratory:  Negative for cough, hemoptysis, sputum production, shortness of breath and wheezing.   Cardiovascular:  Negative for chest pain, palpitations, orthopnea and leg swelling.  Gastrointestinal:  Negative for abdominal pain, blood in stool, constipation, diarrhea, heartburn, melena, nausea and vomiting.  Genitourinary:  Negative for dysuria, frequency and urgency.  Musculoskeletal:  Positive for joint pain. Negative for back pain.  Skin: Negative.  Negative for itching and rash.  Neurological:  Negative for dizziness, tingling, focal weakness, weakness and headaches.  Endo/Heme/Allergies:  Does not bruise/bleed easily.  Psychiatric/Behavioral:  Negative for depression. The patient is not nervous/anxious and does not have insomnia.      MEDICAL HISTORY:  Past Medical History:  Diagnosis Date    Arthritis    Cancer ([place])    left breast   COPD (chronic obstructive pulmonary disease) (HCC)    Diabetes mellitus without complication (HCC)    Dyspnea    Epilepsy ([place])    no siezures in 43 years   Pneumonia    possibly beginning of November 2022-pt states md did not do cxr but was treating it as pneumonia   Sleep apnea    could not tolerate cpap    SURGICAL HISTORY: Past Surgical History:  Procedure Laterality Date   ABDOMINAL HYSTERECTOMY     APPENDECTOMY     BREAST BIOPSY Right    benign 15 years ago   BREAST BIOPSY Left 05/05/2021   stereo bx-calcs/"RIBBON" clip-path pending   CHOLECYSTECTOMY     COLONOSCOPY     JOINT REPLACEMENT Bilateral    2005 and 2018 knees   SIMPLE MASTECTOMY WITH AXILLARY SENTINEL NODE BIOPSY Left 05/26/2021   Procedure: SIMPLE MASTECTOMY WITH AXILLARY SENTINEL NODE BIOPSY;  Surgeon: Robert Bellow, MD;  Location: [facility] ORS;  Service: General;  Laterality: Left;    SOCIAL HISTORY: Social History   Socioeconomic History   Marital status: Married    Spouse name: Not on file   Number of children: Not on file   Years of education: Not on file   Highest education level: Not on file  Occupational History   Not on file  Tobacco Use   Smoking status: Former    Packs/day: 1.00    Years: 15.00    Total pack years: 15.00    Types: Cigarettes    Quit date: 2002    Years since quitting: 21.9   Smokeless tobacco: Never   Tobacco comments:    quit 20 years ago  Vaping Use   Vaping Use: Never used  Substance and Sexual Activity   Alcohol use: Not Currently   Drug use: Not Currently   Sexual activity: Not Currently  Other Topics Concern   Not on file  Social History Narrative   Patient lives in home with husband. Children and husband will be there to help out.   Feels safe in her home.  Does not work outside of home.   Social Determinants of Health   Financial Resource Strain: Not on file  Food Insecurity: Not on file   Transportation Needs: Not on file  Physical Activity: Not on file  Stress: Not on file  Social Connections: Not on file  Intimate Partner Violence: Not on file    FAMILY HISTORY: Family History  Problem Relation Age of Onset   Breast cancer Maternal Aunt 49    ALLERGIES:  is allergic to vicodin [hydrocodone-acetaminophen].  MEDICATIONS:  Current Outpatient Medications  Medication Sig Dispense Refill   acetaminophen (TYLENOL) 500 MG tablet Take 1,000 mg by mouth every 6 (six) hours as needed for moderate pain.     albuterol (  VENTOLIN HFA) 108 (90 Base) MCG/ACT inhaler Inhale 2 puffs into the lungs every 6 (six) hours as needed for wheezing or shortness of breath.     atorvastatin (LIPITOR) 20 MG tablet Take 20 mg by mouth daily.     fluticasone (FLOVENT HFA) 220 MCG/ACT inhaler Inhale 2 puffs into the lungs 2 (two) times daily.     gabapentin (NEURONTIN) 300 MG capsule Take 300 mg by mouth at bedtime.     JARDIANCE 10 MG TABS tablet Take 25 mg by mouth daily.     loratadine (CLARITIN) 10 MG tablet Take 10 mg by mouth daily as needed for allergies.     Semaglutide,0.25 or 0.5MG/DOS, (OZEMPIC, 0.25 OR 0.5 MG/DOSE,) 2 MG/1.5ML SOPN Inject into the skin once a week.     sertraline (ZOLOFT) 100 MG tablet Take 150 mg by mouth at bedtime.     exemestane (AROMASIN) 25 MG tablet Take 1 tablet (25 mg total) by mouth daily after breakfast. 90 tablet 1   No current facility-administered medications for this visit.      Marland Kitchen  PHYSICAL EXAMINATION: ECOG PERFORMANCE STATUS: 0 - Asymptomatic  Vitals:   05/19/22 1400  BP: (!) 140/82  Pulse: 73  Resp: 16  Temp: 100 F (37.8 C)   Filed Weights   05/19/22 1400  Weight: (!) 308 lb 8 oz (139.9 kg)   Left mastectomy.  Drain in place. Physical Exam Vitals and nursing note reviewed.  Constitutional:      Comments:     HENT:     Head: Normocephalic and atraumatic.     Mouth/Throat:     Mouth: Mucous membranes are moist.     Pharynx:  No oropharyngeal exudate.  Eyes:     Pupils: Pupils are equal, round, and reactive to light.  Cardiovascular:     Rate and Rhythm: Normal rate and regular rhythm.  Pulmonary:     Effort: No respiratory distress.     Breath sounds: No wheezing.     Comments: Decreased breath sounds bilaterally at bases.  No wheeze or crackles Abdominal:     General: Bowel sounds are normal. There is no distension.     Palpations: Abdomen is soft. There is no mass.     Tenderness: There is no abdominal tenderness. There is no guarding or rebound.  Musculoskeletal:        General: No tenderness. Normal range of motion.     Cervical back: Normal range of motion and neck supple.  Skin:    General: Skin is warm.  Neurological:     Mental Status: She is alert and oriented to person, place, and time.  Psychiatric:        Mood and Affect: Affect normal.        Judgment: Judgment normal.      LABORATORY DATA:  I have reviewed the data as listed Lab Results  Component Value Date   WBC 6.4 01/24/2022   HGB 13.7 01/24/2022   HCT 40.9 01/24/2022   MCV 92.5 01/24/2022   PLT 201 01/24/2022   Recent Labs    05/21/21 1223 01/24/22 1636  NA 139 138  K 3.8 3.7  CL 107 107  CO2 25 23  GLUCOSE 158* 298*  BUN 20 19  CREATININE 0.59 0.78  CALCIUM 8.9 8.9  GFRNONAA >60 >60    RADIOGRAPHIC STUDIES: I have personally reviewed the radiological images as listed and agreed with the findings in the report. No results found.  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 Currently on Aromasin-tolerating Aromasin well.  Prescription refilled for 90 days.  UNI MAMMOGRAM- oct 2023- WNL>   # Hot flashes-grade -improved- STABLE.  If worse would recommend Effexor.  # Screening for osteoprosis: FEB 2023- BMDT-score of 0.0. continue  ca+vitD BID. STABLE.   # Diabetes/weight loss:on Ozempic weekly; weight gain 8 pounds in 2 months [Charles Drew]; recommend dietary discretion.   Worse-defer to PCP.  # DISPOSITION: # follow up in 6 month; MD; labs- cbc/cmp- Dr.B  All questions were answered. The patient/family knows to call the clinic with any problems, questions or concerns.    Cammie Sickle, MD 05/19/2022 3:16 PM

## 2022-11-18 ENCOUNTER — Inpatient Hospital Stay: Payer: Medicaid Other | Attending: Oncology

## 2022-11-18 ENCOUNTER — Inpatient Hospital Stay (HOSPITAL_BASED_OUTPATIENT_CLINIC_OR_DEPARTMENT_OTHER): Payer: Medicaid Other | Admitting: Internal Medicine

## 2022-11-18 ENCOUNTER — Encounter: Payer: Self-pay | Admitting: Internal Medicine

## 2022-11-18 VITALS — BP 156/78 | HR 77 | Temp 99.3°F | Ht 64.0 in | Wt 320.2 lb

## 2022-11-18 DIAGNOSIS — E119 Type 2 diabetes mellitus without complications: Secondary | ICD-10-CM | POA: Diagnosis not present

## 2022-11-18 DIAGNOSIS — Z79811 Long term (current) use of aromatase inhibitors: Secondary | ICD-10-CM | POA: Diagnosis not present

## 2022-11-18 DIAGNOSIS — N951 Menopausal and female climacteric states: Secondary | ICD-10-CM | POA: Diagnosis not present

## 2022-11-18 DIAGNOSIS — Z170 Estrogen receptor positive status [ER+]: Secondary | ICD-10-CM | POA: Insufficient documentation

## 2022-11-18 DIAGNOSIS — C50312 Malignant neoplasm of lower-inner quadrant of left female breast: Secondary | ICD-10-CM | POA: Insufficient documentation

## 2022-11-18 LAB — COMPREHENSIVE METABOLIC PANEL
ALT: 23 U/L (ref 0–44)
AST: 20 U/L (ref 15–41)
Albumin: 3.7 g/dL (ref 3.5–5.0)
Alkaline Phosphatase: 91 U/L (ref 38–126)
Anion gap: 8 (ref 5–15)
BUN: 17 mg/dL (ref 8–23)
CO2: 24 mmol/L (ref 22–32)
Calcium: 8.7 mg/dL — ABNORMAL LOW (ref 8.9–10.3)
Chloride: 107 mmol/L (ref 98–111)
Creatinine, Ser: 0.76 mg/dL (ref 0.44–1.00)
GFR, Estimated: >60 mL/min (ref >60)
Glucose, Bld: 254 mg/dL — ABNORMAL HIGH (ref 70–99)
Potassium: 4 mmol/L (ref 3.5–5.1)
Sodium: 139 mmol/L (ref 135–145)
Total Bilirubin: 1.1 mg/dL (ref 0.3–1.2)
Total Protein: 6.7 g/dL (ref 6.5–8.1)

## 2022-11-18 LAB — CBC WITH DIFFERENTIAL/PLATELET
Abs Immature Granulocytes: 0.04 10*3/uL (ref 0.00–0.07)
Basophils Absolute: 0 10*3/uL (ref 0.0–0.1)
Basophils Relative: 1 %
Eosinophils Absolute: 0.1 10*3/uL (ref 0.0–0.5)
Eosinophils Relative: 2 %
HCT: 40.1 % (ref 36.0–46.0)
Hemoglobin: 13.4 g/dL (ref 12.0–15.0)
Immature Granulocytes: 1 %
Lymphocytes Relative: 34 %
Lymphs Abs: 1.9 10*3/uL (ref 0.7–4.0)
MCH: 30.9 pg (ref 26.0–34.0)
MCHC: 33.4 g/dL (ref 30.0–36.0)
MCV: 92.6 fL (ref 80.0–100.0)
Monocytes Absolute: 0.3 10*3/uL (ref 0.1–1.0)
Monocytes Relative: 6 %
Neutro Abs: 3.2 10*3/uL (ref 1.7–7.7)
Neutrophils Relative %: 56 %
Platelets: 184 10*3/uL (ref 150–400)
RBC: 4.33 MIL/uL (ref 3.87–5.11)
RDW: 12.5 % (ref 11.5–15.5)
WBC: 5.6 10*3/uL (ref 4.0–10.5)
nRBC: 0 % (ref 0.0–0.2)

## 2022-11-18 NOTE — Progress Notes (Signed)
No concerns today 

## 2022-11-18 NOTE — Progress Notes (Signed)
[facility] CONSULT NOTE  Patient Care Team: Center, [facility] as PCP - General (General Practice) Jim Like, RN as Registered Nurse Collene Mares, Harlow Asa, RN (Inactive) as Registered Nurse Scarlett Presto, RN (Inactive) as Oncology Nurse Navigator Earna Coder, MD as Consulting Physician (Internal Medicine) Lemar Livings Merrily Pew, MD as Consulting Physician (General Surgery)  CHIEF COMPLAINTS/PURPOSE OF CONSULTATION: Breast cancer  #  Oncology History Overview Note  DIAGNOSIS:  A. LEFT BREAST, 8:30; ULTRASOUND-GUIDED BIOPSY:  - INVASIVE MAMMARY CARCINOMA, NO SPECIAL TYPE.   Size of invasive carcinoma: 4 mm in this sample  Histologic grade of invasive carcinoma: Grade 2                       Glandular/tubular differentiation score: 3                       Nuclear pleomorphism score: 2                       Mitotic rate score: 1                       Total score: 6  Ductal carcinoma in situ: Not identified  Lymphovascular invasion: Not identified  PATHOLOGIC STAGE CLASSIFICATION (pTNM, AJCC 8th Edition):  TNM Descriptors: Not applicable  pT Category: pT1b  Regional Lymph Nodes Modifier:  pN Category: pN0  pM Category: Not applicable   SPECIAL STUDIES  Breast Biomarker Testing Performed on Previous Biopsy: [id number]  Estrogen Receptor (ER) Status: POSITIVE          Percentage of cells with nuclear positivity: 91-100%          Average intensity of staining: Strong   Progesterone Receptor (PgR) Status: POSITIVE          Percentage of cells with nuclear positivity: 81-90%          Average intensity of staining: Moderate   HER2 (by immunohistochemistry): NEGATIVE (Score 0)  Ki-67: Not performed  JAN 2023- pT1b:G-1 [s/p mastectomy- pt pref; Dr.Byrnett]; no Oncotype; no adjuvant radiation- FEB 2023-anastrozole; STOPPED in SEP SEP 2023-severe hot flashes see/fatigue. 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  With friend.   Cheryl Silva 62 y.o.  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  Patient is currently on Ozempic.  However is gained 20 pounds in the last 6-8 months.   Otherwise, Patient denies new problems/concerns today.   Patient continues to have hot flashes and fatigue.  However symptoms are tolerable on Aromasin.    Review of Systems  Constitutional:  Negative for chills, diaphoresis, fever, malaise/fatigue and weight loss.  HENT:  Negative for nosebleeds and sore throat.   Eyes:  Negative for double vision.  Respiratory:  Negative for cough, hemoptysis, sputum production, shortness of breath and wheezing.   Cardiovascular:  Negative for chest  pain, palpitations, orthopnea and leg swelling.  Gastrointestinal:  Negative for abdominal pain, blood in stool, constipation, diarrhea, heartburn, melena, nausea and vomiting.  Genitourinary:  Negative for dysuria, frequency and urgency.  Musculoskeletal:  Positive for joint pain. Negative for back pain.  Skin: Negative.  Negative for itching and rash.  Neurological:  Negative for dizziness, tingling, focal weakness, weakness and headaches.  Endo/Heme/Allergies:  Does not bruise/bleed easily.  Psychiatric/Behavioral:  Negative for depression. The patient is not nervous/anxious and does not have insomnia.      MEDICAL HISTORY:  Past Medical History:  Diagnosis Date   Arthritis     Cancer (HCC)    left breast   COPD (chronic obstructive pulmonary disease) (HCC)    Diabetes mellitus without complication (HCC)    Dyspnea    Epilepsy (HCC)    no siezures in 43 years   Pneumonia    possibly beginning of November 2022-pt states md did not do cxr but was treating it as pneumonia   Sleep apnea    could not tolerate cpap    SURGICAL HISTORY: Past Surgical History:  Procedure Laterality Date   ABDOMINAL HYSTERECTOMY     APPENDECTOMY     BREAST BIOPSY Right    benign 15 years ago   BREAST BIOPSY Left 05/05/2021   stereo bx-calcs/"RIBBON" clip-path pending   CHOLECYSTECTOMY     COLONOSCOPY     JOINT REPLACEMENT Bilateral    2005 and 2018 knees   SIMPLE MASTECTOMY WITH AXILLARY SENTINEL NODE BIOPSY Left 05/26/2021   Procedure: SIMPLE MASTECTOMY WITH AXILLARY SENTINEL NODE BIOPSY;  Surgeon: Earline Mayotte, MD;  Location: [facility] ORS;  Service: General;  Laterality: Left;    SOCIAL HISTORY: Social History   Socioeconomic History   Marital status: Married    Spouse name: Not on file   Number of children: Not on file   Years of education: Not on file   Highest education level: Not on file  Occupational History   Not on file  Tobacco Use   Smoking status: Former    Packs/day: 1.00    Years: 15.00    Additional pack years: 0.00    Total pack years: 15.00    Types: Cigarettes    Quit date: 2002    Years since quitting: 22.4   Smokeless tobacco: Never   Tobacco comments:    quit 20 years ago  Vaping Use   Vaping Use: Never used  Substance and Sexual Activity   Alcohol use: Not Currently   Drug use: Not Currently   Sexual activity: Not Currently  Other Topics Concern   Not on file  Social History Narrative   Patient lives in home with husband. Children and husband will be there to help out.   Feels safe in her home.  Does not work outside of home.   Social Determinants of Health   Financial Resource Strain: Not on file  Food Insecurity: Not on  file  Transportation Needs: Not on file  Physical Activity: Not on file  Stress: Not on file  Social Connections: Not on file  Intimate Partner Violence: Not on file    FAMILY HISTORY: Family History  Problem Relation Age of Onset   Breast cancer Maternal Aunt 61    ALLERGIES:  is allergic to vicodin [hydrocodone-acetaminophen].  MEDICATIONS:  Current Outpatient Medications  Medication Sig Dispense Refill   acetaminophen (TYLENOL) 500 MG tablet Take 1,000 mg by mouth every 6 (six) hours as needed for moderate pain.  albuterol (VENTOLIN HFA) 108 (90 Base) MCG/ACT inhaler Inhale 2 puffs into the lungs every 6 (six) hours as needed for wheezing or shortness of breath.     atorvastatin (LIPITOR) 20 MG tablet Take 20 mg by mouth daily.     exemestane (AROMASIN) 25 MG tablet Take 1 tablet (25 mg total) by mouth daily after breakfast. 90 tablet 1   fluticasone (FLOVENT HFA) 220 MCG/ACT inhaler Inhale 2 puffs into the lungs 2 (two) times daily.     gabapentin (NEURONTIN) 300 MG capsule Take 300 mg by mouth at bedtime.     JARDIANCE 10 MG TABS tablet Take 25 mg by mouth daily.     loratadine (CLARITIN) 10 MG tablet Take 10 mg by mouth daily as needed for allergies.     Semaglutide,0.25 or 0.5MG /DOS, (OZEMPIC, 0.25 OR 0.5 MG/DOSE,) 2 MG/1.5ML SOPN Inject into the skin once a week.     sertraline (ZOLOFT) 100 MG tablet Take 150 mg by mouth at bedtime.     No current facility-administered medications for this visit.      Marland Kitchen  PHYSICAL EXAMINATION: ECOG PERFORMANCE STATUS: 0 - Asymptomatic  Vitals:   11/18/22 1326  BP: (!) 156/78  Pulse: 77  Temp: 99.3 F (37.4 C)  SpO2: 97%   Filed Weights   11/18/22 1326  Weight: (!) 320 lb 3.2 oz (145.2 kg)   Left mastectomy.  Drain in place. Physical Exam Vitals and nursing note reviewed.  Constitutional:      Comments:     HENT:     Head: Normocephalic and atraumatic.     Mouth/Throat:     Mouth: Mucous membranes are moist.      Pharynx: No oropharyngeal exudate.  Eyes:     Pupils: Pupils are equal, round, and reactive to light.  Cardiovascular:     Rate and Rhythm: Normal rate and regular rhythm.  Pulmonary:     Effort: No respiratory distress.     Breath sounds: No wheezing.     Comments: Decreased breath sounds bilaterally at bases.  No wheeze or crackles Abdominal:     General: Bowel sounds are normal. There is no distension.     Palpations: Abdomen is soft. There is no mass.     Tenderness: There is no abdominal tenderness. There is no guarding or rebound.  Musculoskeletal:        General: No tenderness. Normal range of motion.     Cervical back: Normal range of motion and neck supple.  Skin:    General: Skin is warm.  Neurological:     Mental Status: She is alert and oriented to person, place, and time.  Psychiatric:        Mood and Affect: Affect normal.        Judgment: Judgment normal.      LABORATORY DATA:  I have reviewed the data as listed Lab Results  Component Value Date   WBC 5.6 11/18/2022   HGB 13.4 11/18/2022   HCT 40.1 11/18/2022   MCV 92.6 11/18/2022   PLT 184 11/18/2022   Recent Labs    01/24/22 1636 11/18/22 1320  NA 138 139  K 3.7 4.0  CL 107 107  CO2 23 24  GLUCOSE 298* 254*  BUN 19 17  CREATININE 0.78 0.76  CALCIUM 8.9 8.7*  GFRNONAA >60 >60  PROT  --  6.7  ALBUMIN  --  3.7  AST  --  20  ALT  --  23  ALKPHOS  --  91  BILITOT  --  1.1    RADIOGRAPHIC STUDIES: I have personally reviewed the radiological images as listed and agreed with the findings in the report. No results found.  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 Currently on Aromasin-tolerating Aromasin well [until spring 2028]  Prescription refilled for 90 days.  UNI MAMMOGRAM- oct 2023- WNL. Stable. Ordered mammo-    # Hot flashes-grade -improved- stable.  If worse would recommend Effexor.  # Screening for osteoprosis: FEB 2023- BMDT-score of 0.0. continue ca+vitD BID. Stable; will check vitD next visit.  Will order BMD in 2025.   # Diabetes/weight loss: PBG- 254; on Ozempic weekly; weight gain 20 pounds  [in last 6-8 monthsCharles Drew]; recommend dietary discretion. Worse  -defer to PCP.  # DISPOSITION: # Mammogram Uni Right in OCT 2024.  # follow up in 6 month; MD; labs- cbc/cmp-vitD - Dr.B  All questions were answered. The patient/family knows to call the clinic with any problems, questions or concerns.    Earna Coder, MD 11/18/2022 2:38 PM

## 2022-11-18 NOTE — Assessment & Plan Note (Addendum)
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 Currently on Aromasin-tolerating Aromasin well [until spring 2028]  Prescription refilled for 90 days.  UNI MAMMOGRAM- oct 2023- WNL. Stable. Ordered mammo-   # Hot flashes-grade -improved- stable.  If worse would recommend Effexor.  # Screening for osteoprosis: FEB 2023- BMDT-score of 0.0. continue ca+vitD BID. Stable; will check vitD next visit.  Will order BMD in 2025.   # Diabetes/weight loss: PBG- 254; on Ozempic weekly; weight gain 20 pounds  [in last 6-8 monthsCharles Silva]; recommend dietary discretion. Worse  -defer to PCP.  # DISPOSITION: # Mammogram Uni Right in OCT 2024.  # follow up in 6 month; MD; labs- cbc/cmp-vitD - Dr.B

## 2023-04-15 ENCOUNTER — Ambulatory Visit
Admission: RE | Admit: 2023-04-15 | Discharge: 2023-04-15 | Disposition: A | Discharge status: Home / Self Care | Payer: Medicaid Other | Source: Ambulatory Visit | Attending: Internal Medicine | Admitting: Internal Medicine

## 2023-04-15 DIAGNOSIS — C50312 Malignant neoplasm of lower-inner quadrant of left female breast: Secondary | ICD-10-CM | POA: Diagnosis present

## 2023-04-15 DIAGNOSIS — Z170 Estrogen receptor positive status [ER+]: Secondary | ICD-10-CM | POA: Insufficient documentation

## 2023-04-15 DIAGNOSIS — Z1231 Encounter for screening mammogram for malignant neoplasm of breast: Secondary | ICD-10-CM | POA: Diagnosis not present

## 2023-05-24 ENCOUNTER — Inpatient Hospital Stay: Payer: Medicaid Other | Admitting: Internal Medicine

## 2023-05-24 ENCOUNTER — Inpatient Hospital Stay: Payer: Medicaid Other

## 2023-05-25 ENCOUNTER — Inpatient Hospital Stay: Payer: Medicaid Other | Admitting: Internal Medicine

## 2023-05-25 ENCOUNTER — Inpatient Hospital Stay: Payer: Medicaid Other

## 2023-06-21 ENCOUNTER — Inpatient Hospital Stay: Payer: Medicaid Other | Admitting: Internal Medicine

## 2023-06-21 ENCOUNTER — Inpatient Hospital Stay: Payer: Medicaid Other

## 2023-07-07 ENCOUNTER — Inpatient Hospital Stay: Payer: Medicaid Other

## 2023-07-07 ENCOUNTER — Inpatient Hospital Stay: Payer: Medicaid Other | Admitting: Internal Medicine

## 2023-07-16 ENCOUNTER — Encounter: Payer: Self-pay | Admitting: Internal Medicine

## 2023-07-16 ENCOUNTER — Inpatient Hospital Stay: Payer: Medicaid Other | Attending: Internal Medicine

## 2023-07-16 ENCOUNTER — Inpatient Hospital Stay (HOSPITAL_BASED_OUTPATIENT_CLINIC_OR_DEPARTMENT_OTHER): Payer: Medicaid Other | Admitting: Internal Medicine

## 2023-07-16 ENCOUNTER — Other Ambulatory Visit: Payer: Self-pay | Admitting: Internal Medicine

## 2023-07-16 VITALS — BP 130/70 | HR 68 | Temp 98.5°F | Resp 20 | Wt 317.0 lb

## 2023-07-16 DIAGNOSIS — Z79811 Long term (current) use of aromatase inhibitors: Secondary | ICD-10-CM | POA: Insufficient documentation

## 2023-07-16 DIAGNOSIS — N951 Menopausal and female climacteric states: Secondary | ICD-10-CM | POA: Insufficient documentation

## 2023-07-16 DIAGNOSIS — Z170 Estrogen receptor positive status [ER+]: Secondary | ICD-10-CM | POA: Insufficient documentation

## 2023-07-16 DIAGNOSIS — Z7985 Long-term (current) use of injectable non-insulin antidiabetic drugs: Secondary | ICD-10-CM | POA: Diagnosis not present

## 2023-07-16 DIAGNOSIS — C50312 Malignant neoplasm of lower-inner quadrant of left female breast: Secondary | ICD-10-CM | POA: Diagnosis present

## 2023-07-16 LAB — CBC WITH DIFFERENTIAL (CANCER CENTER ONLY)
Abs Immature Granulocytes: 0.03 10*3/uL (ref 0.00–0.07)
Basophils Absolute: 0.1 10*3/uL (ref 0.0–0.1)
Basophils Relative: 1 %
Eosinophils Absolute: 0.2 10*3/uL (ref 0.0–0.5)
Eosinophils Relative: 2 %
HCT: 43 % (ref 36.0–46.0)
Hemoglobin: 14.2 g/dL (ref 12.0–15.0)
Immature Granulocytes: 0 %
Lymphocytes Relative: 26 %
Lymphs Abs: 1.8 10*3/uL (ref 0.7–4.0)
MCH: 30.9 pg (ref 26.0–34.0)
MCHC: 33 g/dL (ref 30.0–36.0)
MCV: 93.5 fL (ref 80.0–100.0)
Monocytes Absolute: 0.5 10*3/uL (ref 0.1–1.0)
Monocytes Relative: 8 %
Neutro Abs: 4.5 10*3/uL (ref 1.7–7.7)
Neutrophils Relative %: 63 %
Platelet Count: 179 10*3/uL (ref 150–400)
RBC: 4.6 MIL/uL (ref 3.87–5.11)
RDW: 12.3 % (ref 11.5–15.5)
WBC Count: 7.2 10*3/uL (ref 4.0–10.5)
nRBC: 0 % (ref 0.0–0.2)

## 2023-07-16 LAB — CMP (CANCER CENTER ONLY)
ALT: 25 U/L (ref 0–44)
AST: 20 U/L (ref 15–41)
Albumin: 3.8 g/dL (ref 3.5–5.0)
Alkaline Phosphatase: 83 U/L (ref 38–126)
Anion gap: 8 (ref 5–15)
BUN: 20 mg/dL (ref 8–23)
CO2: 26 mmol/L (ref 22–32)
Calcium: 9.3 mg/dL (ref 8.9–10.3)
Chloride: 107 mmol/L (ref 98–111)
Creatinine: 0.81 mg/dL (ref 0.44–1.00)
GFR, Estimated: >60 mL/min (ref >60)
Glucose, Bld: 114 mg/dL — ABNORMAL HIGH (ref 70–99)
Potassium: 4.2 mmol/L (ref 3.5–5.1)
Sodium: 141 mmol/L (ref 135–145)
Total Bilirubin: 1.5 mg/dL — ABNORMAL HIGH (ref 0.0–1.2)
Total Protein: 7.2 g/dL (ref 6.5–8.1)

## 2023-07-16 LAB — VITAMIN D 25 HYDROXY (VIT D DEFICIENCY, FRACTURES): Vit D, 25-Hydroxy: 24.26 ng/mL — ABNORMAL LOW (ref 30–100)

## 2023-07-16 MED ORDER — EXEMESTANE 25 MG PO TABS: 25.0000 mg | ORAL_TABLET | Freq: Every day | ORAL | 1 refills | Status: DC

## 2023-07-16 MED ORDER — ERGOCALCIFEROL 1.25 MG (50000 UT) PO CAPS: 50000.0000 [IU] | ORAL_CAPSULE | ORAL | 1 refills | Status: DC

## 2023-07-16 NOTE — Progress Notes (Signed)
Patient needs a refill on the only medication that Dr. B is prescribing to her. No new questions or concerns for the doctor today.

## 2023-07-16 NOTE — Progress Notes (Signed)
Please order vitamin D levels at next visit- GB

## 2023-07-16 NOTE — Assessment & Plan Note (Addendum)
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 Currently on Aromasin-tolerating Aromasin well [until spring 2028]  Prescription refilled for 90 days.  UNI MAMMOGRAM- oct 2024- WNL. Stable. will order at next visit.   # Hot flashes-grade -improved-  stable.   # Screening for osteoprosis: FEB 2023- BMD T-score of 0.0. continue ca+vitD BID. Stable; will check vitD next visit.  Will order BMD in 2025- will order at next visit.   # Diabetes/weight loss: PBF-114; on Ozempic weekly; mild weight loss-  [in last 6-8 monthsCharles Silva]; recommend dietary discretion. Defer to PCP.  # DISPOSITION: # follow up in 6 month; MD; NO labs- - Dr.B

## 2023-07-16 NOTE — Progress Notes (Signed)
Needs refill of aromasin, pended.

## 2023-07-16 NOTE — Progress Notes (Signed)
[facility] CONSULT NOTE  Patient Care Team: Center, [facility] as PCP - General (General Practice) Jim Like, RN as Registered Nurse Collene Mares, Harlow Asa, RN (Inactive) as Registered Nurse Scarlett Presto, RN (Inactive) as Oncology Nurse Navigator Earna Coder, MD as Consulting Physician (Internal Medicine) Lemar Livings Merrily Pew, MD as Consulting Physician (General Surgery)  CHIEF COMPLAINTS/PURPOSE OF CONSULTATION: Breast cancer  #  Oncology History Overview Note  DIAGNOSIS:  A. LEFT BREAST, 8:30; ULTRASOUND-GUIDED BIOPSY:  - INVASIVE MAMMARY CARCINOMA, NO SPECIAL TYPE.   Size of invasive carcinoma: 4 mm in this sample  Histologic grade of invasive carcinoma: Grade 2                       Glandular/tubular differentiation score: 3                       Nuclear pleomorphism score: 2                       Mitotic rate score: 1                       Total score: 6  Ductal carcinoma in situ: Not identified  Lymphovascular invasion: Not identified  PATHOLOGIC STAGE CLASSIFICATION (pTNM, AJCC 8th Edition):  TNM Descriptors: Not applicable  pT Category: pT1b  Regional Lymph Nodes Modifier:  pN Category: pN0  pM Category: Not applicable   SPECIAL STUDIES  Breast Biomarker Testing Performed on Previous Biopsy: [id number]  Estrogen Receptor (ER) Status: POSITIVE          Percentage of cells with nuclear positivity: 91-100%          Average intensity of staining: Strong   Progesterone Receptor (PgR) Status: POSITIVE          Percentage of cells with nuclear positivity: 81-90%          Average intensity of staining: Moderate   HER2 (by immunohistochemistry): NEGATIVE (Score 0)  Ki-67: Not performed  JAN 2023- pT1b:G-1 [s/p mastectomy- pt pref; Dr.Byrnett]; no Oncotype; no adjuvant radiation- FEB 2023-anastrozole; STOPPED in SEP SEP 2023-severe hot flashes see/fatigue. 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  Alone.   Cheryl Silva 63 y.o.  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  Patient is currently on Ozempic.  Lost about 3 pounds since the last visit.  Patient's weight gain from from GI and also dietary discretion.  Otherwise, Patient denies new problems/concerns today.   Patient continues to have hot flashes-overall improved. Mild  fatigue.  However symptoms are tolerable on Aromasin.    Review of Systems  Constitutional:  Negative for chills, diaphoresis, fever, malaise/fatigue and weight loss.  HENT:  Negative for nosebleeds and sore throat.   Eyes:  Negative for double vision.  Respiratory:  Negative for cough, hemoptysis, sputum production, shortness of breath  and wheezing.   Cardiovascular:  Negative for chest pain, palpitations, orthopnea and leg swelling.  Gastrointestinal:  Negative for abdominal pain, blood in stool, constipation, diarrhea, heartburn, melena, nausea and vomiting.  Genitourinary:  Negative for dysuria, frequency and urgency.  Musculoskeletal:  Positive for joint pain. Negative for back pain.  Skin: Negative.  Negative for itching and rash.  Neurological:  Negative for dizziness, tingling, focal weakness, weakness and headaches.  Endo/Heme/Allergies:  Does not bruise/bleed easily.  Psychiatric/Behavioral:  Negative for depression. The patient is not nervous/anxious and does not have insomnia.      MEDICAL  HISTORY:  Past Medical History:  Diagnosis Date   Arthritis    Cancer (HCC)    left breast   COPD (chronic obstructive pulmonary disease) (HCC)    Diabetes mellitus without complication (HCC)    Dyspnea    Epilepsy (HCC)    no siezures in 43 years   Pneumonia    possibly beginning of November 2022-pt states md did not do cxr but was treating it as pneumonia   Sleep apnea    could not tolerate cpap    SURGICAL HISTORY: Past Surgical History:  Procedure Laterality Date   ABDOMINAL HYSTERECTOMY     APPENDECTOMY     BREAST BIOPSY Right    benign 15 years ago   BREAST BIOPSY Left 05/05/2021   stereo bx-calcs/"RIBBON" clip-path pending   CHOLECYSTECTOMY     COLONOSCOPY     JOINT REPLACEMENT Bilateral    2005 and 2018 knees   SIMPLE MASTECTOMY WITH AXILLARY SENTINEL NODE BIOPSY Left 05/26/2021   Procedure: SIMPLE MASTECTOMY WITH AXILLARY SENTINEL NODE BIOPSY;  Surgeon: Earline Mayotte, MD;  Location: [facility] ORS;  Service: General;  Laterality: Left;    SOCIAL HISTORY: Social History   Socioeconomic History   Marital status: Married    Spouse name: Not on file   Number of children: Not on file   Years of education: Not on file   Highest education level: Not on file  Occupational History   Not on file  Tobacco Use   Smoking status: Former    Current packs/day: 0.00    Average packs/day: 1 pack/day for 15.0 years (15.0 ttl pk-yrs)    Types: Cigarettes    Start date: 33    Quit date: 2002    Years since quitting: 23.0   Smokeless tobacco: Never   Tobacco comments:    quit 20 years ago  Vaping Use   Vaping status: Never Used  Substance and Sexual Activity   Alcohol use: Not Currently   Drug use: Not Currently   Sexual activity: Not Currently  Other Topics Concern   Not on file  Social History Narrative   Patient lives in home with husband. Children and husband will be there to help out.   Feels safe in her home.  Does not work outside of home.   Social  Drivers of Corporate investment banker Strain: Not on file  Food Insecurity: Not on file  Transportation Needs: Not on file  Physical Activity: Not on file  Stress: Not on file  Social Connections: Not on file  Intimate Partner Violence: Not on file    FAMILY HISTORY: Family History  Problem Relation Age of Onset   Breast cancer Maternal Aunt 82    ALLERGIES:  is allergic to vicodin [hydrocodone-acetaminophen].  MEDICATIONS:  Current Outpatient Medications  Medication Sig Dispense Refill   acetaminophen (TYLENOL) 500 MG tablet Take 1,000 mg by mouth  every 6 (six) hours as needed for moderate pain.     albuterol (VENTOLIN HFA) 108 (90 Base) MCG/ACT inhaler Inhale 2 puffs into the lungs every 6 (six) hours as needed for wheezing or shortness of breath.     atorvastatin (LIPITOR) 20 MG tablet Take 20 mg by mouth daily.     exemestane (AROMASIN) 25 MG tablet Take 1 tablet (25 mg total) by mouth daily after breakfast. 90 tablet 1   fluticasone (FLOVENT HFA) 220 MCG/ACT inhaler Inhale 2 puffs into the lungs 2 (two) times daily.     gabapentin (NEURONTIN) 300 MG capsule Take 300 mg by mouth at bedtime.     JARDIANCE 10 MG TABS tablet Take 25 mg by mouth daily.     loratadine (CLARITIN) 10 MG tablet Take 10 mg by mouth daily as needed for allergies.     Semaglutide,0.25 or 0.5MG /DOS, (OZEMPIC, 0.25 OR 0.5 MG/DOSE,) 2 MG/1.5ML SOPN Inject into the skin once a week.     sertraline (ZOLOFT) 100 MG tablet Take 150 mg by mouth at bedtime.     No current facility-administered medications for this visit.      Marland Kitchen  PHYSICAL EXAMINATION: ECOG PERFORMANCE STATUS: 0 - Asymptomatic  Vitals:   07/16/23 1035  BP: 130/70  Pulse: 68  Resp: 20  Temp: 98.5 F (36.9 C)  SpO2: 99%   Filed Weights   07/16/23 1035  Weight: (!) 317 lb (143.8 kg)   Left mastectomy.  Drain in place. Physical Exam Vitals and nursing note reviewed.  Constitutional:      Comments:     HENT:     Head:  Normocephalic and atraumatic.     Mouth/Throat:     Mouth: Mucous membranes are moist.     Pharynx: No oropharyngeal exudate.  Eyes:     Pupils: Pupils are equal, round, and reactive to light.  Cardiovascular:     Rate and Rhythm: Normal rate and regular rhythm.  Pulmonary:     Effort: No respiratory distress.     Breath sounds: No wheezing.     Comments: Decreased breath sounds bilaterally at bases.  No wheeze or crackles Abdominal:     General: Bowel sounds are normal. There is no distension.     Palpations: Abdomen is soft. There is no mass.     Tenderness: There is no abdominal tenderness. There is no guarding or rebound.  Musculoskeletal:        General: No tenderness. Normal range of motion.     Cervical back: Normal range of motion and neck supple.  Skin:    General: Skin is warm.  Neurological:     Mental Status: She is alert and oriented to person, place, and time.  Psychiatric:        Mood and Affect: Affect normal.        Judgment: Judgment normal.      LABORATORY DATA:  I have reviewed the data as listed Lab Results  Component Value Date   WBC 7.2 07/16/2023   HGB 14.2 07/16/2023   HCT 43.0 07/16/2023   MCV 93.5 07/16/2023   PLT 179 07/16/2023   Recent Labs    11/18/22 1320 07/16/23 1013  NA 139 141  K 4.0 4.2  CL 107 107  CO2 24 26  GLUCOSE 254* 114*  BUN 17 20  CREATININE 0.76 0.81  CALCIUM 8.7* 9.3  GFRNONAA >60 >60  PROT 6.7 7.2  ALBUMIN 3.7 3.8  AST 20 20  ALT 23  25  ALKPHOS 91 83  BILITOT 1.1 1.5*    RADIOGRAPHIC STUDIES: I have personally reviewed the radiological images as listed and agreed with the findings in the report. No results found.  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 Currently on Aromasin-tolerating Aromasin well [until spring 2028]   Prescription refilled for 90 days.  UNI MAMMOGRAM- oct 2024- WNL. Stable. will order at next visit.   # Hot flashes-grade -improved-  stable.   # Screening for osteoprosis: FEB 2023- BMD T-score of 0.0. continue ca+vitD BID. Stable; will check vitD next visit.  Will order BMD in 2025- will order at next visit.   # Diabetes/weight loss: PBF-114; on Ozempic weekly; mild weight loss-  [in last 6-8 monthsCharles Drew]; recommend dietary discretion. Defer to PCP.  # DISPOSITION: # follow up in 6 month; MD; NO labs- - Dr.B   All questions were answered. The patient/family knows to call the clinic with any problems, questions or concerns.    Earna Coder, MD 07/16/2023 11:19 AM

## 2023-07-19 ENCOUNTER — Other Ambulatory Visit: Payer: Self-pay

## 2023-07-19 DIAGNOSIS — Z170 Estrogen receptor positive status [ER+]: Secondary | ICD-10-CM

## 2023-07-19 NOTE — Progress Notes (Signed)
Lab entered

## 2023-12-22 ENCOUNTER — Other Ambulatory Visit: Payer: Self-pay

## 2023-12-22 DIAGNOSIS — Z170 Estrogen receptor positive status [ER+]: Secondary | ICD-10-CM

## 2024-01-08 ENCOUNTER — Other Ambulatory Visit: Payer: Self-pay | Admitting: Internal Medicine

## 2024-01-08 DIAGNOSIS — Z170 Estrogen receptor positive status [ER+]: Secondary | ICD-10-CM

## 2024-01-14 ENCOUNTER — Inpatient Hospital Stay: Payer: Medicaid Other | Admitting: Internal Medicine

## 2024-01-14 ENCOUNTER — Inpatient Hospital Stay: Payer: Medicaid Other

## 2024-01-26 ENCOUNTER — Telehealth: Admitting: Physician Assistant

## 2024-01-26 DIAGNOSIS — R3989 Other symptoms and signs involving the genitourinary system: Secondary | ICD-10-CM | POA: Diagnosis not present

## 2024-01-26 MED ORDER — CEPHALEXIN 500 MG PO CAPS: 500.0000 mg | ORAL_CAPSULE | Freq: Two times a day (BID) | ORAL | 0 refills | Status: DC

## 2024-01-26 NOTE — Progress Notes (Signed)
 Virtual Visit Consent   Cheryl Silva, you are scheduled for a virtual visit with a [facility] provider today. Just as with appointments in the office, your consent must be obtained to participate. Your consent will be active for this visit and any virtual visit you may have with one of our providers in the next 365 days. If you have a MyChart account, a copy of this consent can be sent to you electronically.  As this is a virtual visit, video technology does not allow for your provider to perform a traditional examination. This may limit your provider's ability to fully assess your condition. If your provider identifies any concerns that need to be evaluated in person or the need to arrange testing (such as labs, EKG, etc.), we will make arrangements to do so. Although advances in technology are sophisticated, we cannot ensure that it will always work on either your end or our end. If the connection with a video visit is poor, the visit may have to be switched to a telephone visit. With either a video or telephone visit, we are not always able to ensure that we have a secure connection.  By engaging in this virtual visit, you consent to the provision of healthcare and authorize for your insurance to be billed (if applicable) for the services provided during this visit. Depending on your insurance coverage, you may receive a charge related to this service.  I need to obtain your verbal consent now. Are you willing to proceed with your visit today? Cheryl Silva has provided verbal consent on 01/26/2024 for a virtual visit (video or telephone). Delon CHRISTELLA Dickinson, PA-C  Date: 01/26/2024 7:22 PM   Virtual Visit via Video Note   I, Delon CHRISTELLA Dickinson, connected with  Cheryl Silva  ([id number], 1961/04/15) on 01/26/24 at  7:15 PM EDT by a video-enabled telemedicine application and verified that I am speaking with the correct person using two identifiers.  Location: Patient: Virtual Visit Location  Patient: Home Provider: Virtual Visit Location Provider: Home Office   I discussed the limitations of evaluation and management by telemedicine and the availability of in person appointments. The patient expressed understanding and agreed to proceed.    History of Present Illness: Cheryl Silva is a 63 y.o. who identifies as a female who was assigned female at birth, and is being seen today for dysuria.  HPI: Urinary Tract Infection  This is a new problem. The current episode started yesterday. The problem occurs every urination. The problem has been gradually worsening. The quality of the pain is described as burning. The pain is mild. There has been no fever. Associated symptoms include frequency, hesitancy and urgency. Pertinent negatives include no chills, discharge, flank pain, hematuria, nausea or vomiting. She has tried increased fluids for the symptoms. The treatment provided no relief.     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    Allergies:  Allergies  Allergen Reactions   Vicodin [Hydrocodone-Acetaminophen ] Itching   Medications:  Current Outpatient Medications:    cephALEXin  (KEFLEX ) 500 MG capsule, Take 1 capsule (500 mg total) by mouth 2 (two) times daily., Disp: 14 capsule, Rfl: 0   acetaminophen  (TYLENOL )  500 MG tablet, Take 1,000 mg by mouth every 6 (six) hours as needed for moderate pain., Disp: , Rfl:    albuterol  (VENTOLIN  HFA) 108 (90 Base) MCG/ACT inhaler, Inhale 2 puffs into the lungs every 6 (six) hours as needed for wheezing or shortness of breath., Disp: , Rfl:    atorvastatin (LIPITOR) 20 MG tablet, Take 20 mg by mouth  daily., Disp: , Rfl:    ergocalciferol  (VITAMIN D2) 1.25 MG (50000 UT) capsule, Take 1 capsule (50,000 Units total) by mouth once a week., Disp: 14 capsule, Rfl: 1   exemestane  (AROMASIN ) 25 MG tablet, TAKE 1 TABLET BY MOUTH ONCE DAILY AFTER BREAKFAST, Disp: 90 tablet, Rfl: 0   fluticasone (FLOVENT HFA) 220 MCG/ACT inhaler, Inhale 2 puffs into the lungs 2 (two) times daily., Disp: , Rfl:    gabapentin (NEURONTIN) 300 MG capsule, Take 300 mg by mouth at bedtime., Disp: , Rfl:    JARDIANCE 10 MG TABS tablet, Take 25 mg by mouth daily., Disp: , Rfl:    loratadine (CLARITIN) 10 MG tablet, Take 10 mg by mouth daily as needed for allergies., Disp: , Rfl:    Semaglutide,0.25 or 0.5MG /DOS, (OZEMPIC, 0.25 OR 0.5 MG/DOSE,) 2 MG/1.5ML SOPN, Inject into the skin once a week., Disp: , Rfl:    sertraline (ZOLOFT) 100 MG tablet, Take 150 mg by mouth at bedtime., Disp: , Rfl:   Observations/Objective: Patient is well-developed, well-nourished in no acute distress.  Resting comfortably at home.  Head is normocephalic, atraumatic.  No labored breathing.  Speech is clear and coherent with logical content.  Patient is alert and oriented at baseline.    Assessment and Plan: 1. Suspected UTI (Primary) - cephALEXin  (KEFLEX ) 500 MG capsule; Take 1 capsule (500 mg total) by mouth 2 (two) times daily.  Dispense: 14 capsule; Refill: 0  - Worsening symptoms.  - Will treat empirically with Keflex  - May use AZO for bladder spasms - Continue to push fluids.  - Seek in person evaluation for urine culture if symptoms do not improve or if they worsen.    Follow Up Instructions: I discussed the assessment and treatment plan with the patient. The patient was provided an opportunity to ask questions and all were answered. The patient agreed with the plan and demonstrated an understanding of the instructions.  A copy of instructions were sent to the patient via MyChart unless otherwise noted below.    The patient was  advised to call back or seek an in-person evaluation if the symptoms worsen or if the condition fails to improve as anticipated.    Delon CHRISTELLA Dickinson, PA-C

## 2024-01-26 NOTE — Patient Instructions (Signed)
 Niels LELON Sans, thank you for joining Delon CHRISTELLA Dickinson, PA-C for today's virtual visit.  While this provider is not your primary care provider (PCP), if your PCP is located in our provider database this encounter information will be shared with them immediately following your visit.   A [facility] MyChart account gives you access to today's visit and all your visits, tests, and labs performed at [facility]  click here if you don't have a [facility] MyChart account or go to [email or web address]  Consent: (Patient) Cheryl Silva provided verbal consent for this virtual visit at the beginning of the encounter.  Current Medications:  Current Outpatient Medications:    cephALEXin  (KEFLEX ) 500 MG capsule, Take 1 capsule (500 mg total) by mouth 2 (two) times daily., Disp: 14 capsule, Rfl: 0   acetaminophen  (TYLENOL ) 500 MG tablet, Take 1,000 mg by mouth every 6 (six) hours as needed for moderate pain., Disp: , Rfl:    albuterol  (VENTOLIN  HFA) 108 (90 Base) MCG/ACT inhaler, Inhale 2 puffs into the lungs every 6 (six) hours as needed for wheezing or shortness of breath., Disp: , Rfl:    atorvastatin (LIPITOR) 20 MG tablet, Take 20 mg by mouth daily., Disp: , Rfl:    ergocalciferol  (VITAMIN D2) 1.25 MG (50000 UT) capsule, Take 1 capsule (50,000 Units total) by mouth once a week., Disp: 14 capsule, Rfl: 1   exemestane  (AROMASIN ) 25 MG tablet, TAKE 1 TABLET BY MOUTH ONCE DAILY AFTER BREAKFAST, Disp: 90 tablet, Rfl: 0   fluticasone (FLOVENT HFA) 220 MCG/ACT inhaler, Inhale 2 puffs into the lungs 2 (two) times daily., Disp: , Rfl:    gabapentin (NEURONTIN) 300 MG capsule, Take 300 mg by mouth at bedtime., Disp: , Rfl:    JARDIANCE 10 MG TABS tablet, Take 25 mg by mouth daily., Disp: , Rfl:    loratadine (CLARITIN) 10 MG tablet, Take 10 mg by mouth daily as needed for allergies., Disp: , Rfl:    Semaglutide,0.25 or 0.5MG /DOS, (OZEMPIC, 0.25 OR 0.5 MG/DOSE,) 2 MG/1.5ML SOPN, Inject into  the skin once a week., Disp: , Rfl:    sertraline (ZOLOFT) 100 MG tablet, Take 150 mg by mouth at bedtime., Disp: , Rfl:    Medications ordered in this encounter:  Meds ordered this encounter  Medications   cephALEXin  (KEFLEX ) 500 MG capsule    Sig: Take 1 capsule (500 mg total) by mouth 2 (two) times daily.    Dispense:  14 capsule    Refill:  0    Supervising Provider:   BLAISE ALEENE KIDD [[id number]]     *If you need refills on other medications prior to your next appointment, please contact your pharmacy*  Follow-Up: Call back or seek an in-person evaluation if the symptoms worsen or if the condition fails to improve as anticipated.  [facility] [phone]  Other Instructions Urinary Tract Infection, Female A urinary tract infection (UTI) is an infection in your urinary tract. The urinary tract is made up of organs that make, store, and get rid of pee (urine) in your body. These organs include: The kidneys. The ureters. The bladder. The urethra. What are the causes? Most UTIs are caused by germs called bacteria. They may be in or near your genitals. These germs grow and cause swelling in your urinary tract. What increases the risk? You're more likely to get a UTI if: You're a female. The urethra is shorter in females than in males. You have a soft tube called  a catheter that drains your pee. You can't control when you pee or poop. You have trouble peeing because of: A kidney stone. A urinary blockage. A nerve condition that affects your bladder. Not getting enough to drink. You're sexually active. You use a birth control inside your vagina, like spermicide. You're pregnant. You have low levels of the hormone estrogen in your body. You're an older adult. You're also more likely to get a UTI if you have other health problems. These may include: Diabetes. A weak immune system. Your immune system is your body's defense system. Sickle cell disease. Injury  of the spine. What are the signs or symptoms? Symptoms may include: Needing to pee right away. Peeing small amounts often. Pain or burning when you pee. Blood in your pee. Pee that smells bad or odd. Pain in your belly or lower back. You may also: Feel confused. This may be the first symptom in older adults. Vomit. Not feel hungry. Feel tired or easily annoyed. Have a fever or chills. How is this diagnosed? A UTI is diagnosed based on your medical history and an exam. You may also have other tests. These may include: Pee tests. Blood tests. Tests for sexually transmitted infections (STIs). If you've had more than one UTI, you may need to have imaging studies done to find out why you keep getting them. How is this treated? A UTI can be treated by: Taking antibiotics or other medicines. Drinking enough fluid to keep your pee pale yellow. In rare cases, a UTI can cause a very bad condition called sepsis. Sepsis may be treated in the hospital. Follow these instructions at home: Medicines Take your medicines only as told by your health care provider. If you were given antibiotics, take them as told by your provider. Do not stop taking them even if you start to feel better. General instructions Make sure you: Pee often and fully. Do not hold your pee for a long time. Wipe from front to back after you pee or poop. Use each tissue only once when you wipe. Pee after you have sex. Do not douche or use sprays or powders in your genital area. Contact a health care provider if: Your symptoms don't get better after 1-2 days of taking antibiotics. Your symptoms go away and then come back. You have a fever or chills. You vomit or feel like you may vomit. Get help right away if: You have very bad pain in your back or lower belly. You faint. This information is not intended to replace advice given to you by your health care provider. Make sure you discuss any questions you have with your  health care provider. Document Revised: 05/12/2023 Document Reviewed: 09/04/2022 Elsevier Patient Education  [employer].   If you have been instructed to have an in-person evaluation today at a local Urgent Care facility, please use the link below. It will take you to a list of all of our available [facility], including address, phone number and hours of operation. Please do not delay care.  [facility]  If you or a family member do not have a primary care provider, use the link below to schedule a visit and establish care. When you choose a [facility] primary care physician or advanced practice provider, you gain a long-term partner in health. Find a Primary Care Provider  Learn more about [facility]'s in-office and virtual care options: [facility] - Get Care Now

## 2024-02-15 ENCOUNTER — Ambulatory Visit
Admission: RE | Admit: 2024-02-15 | Discharge: 2024-02-15 | Disposition: A | Discharge status: Home / Self Care | Source: Ambulatory Visit | Attending: Internal Medicine | Admitting: Internal Medicine

## 2024-02-15 DIAGNOSIS — C50312 Malignant neoplasm of lower-inner quadrant of left female breast: Secondary | ICD-10-CM | POA: Diagnosis present

## 2024-02-15 DIAGNOSIS — Z170 Estrogen receptor positive status [ER+]: Secondary | ICD-10-CM | POA: Insufficient documentation

## 2024-03-10 ENCOUNTER — Other Ambulatory Visit: Payer: Self-pay | Admitting: *Deleted

## 2024-03-10 DIAGNOSIS — C50312 Malignant neoplasm of lower-inner quadrant of left female breast: Secondary | ICD-10-CM

## 2024-03-13 ENCOUNTER — Inpatient Hospital Stay (HOSPITAL_BASED_OUTPATIENT_CLINIC_OR_DEPARTMENT_OTHER): Admitting: Internal Medicine

## 2024-03-13 ENCOUNTER — Ambulatory Visit: Payer: Self-pay | Admitting: Internal Medicine

## 2024-03-13 ENCOUNTER — Encounter: Payer: Self-pay | Admitting: Internal Medicine

## 2024-03-13 ENCOUNTER — Other Ambulatory Visit: Payer: Self-pay

## 2024-03-13 ENCOUNTER — Inpatient Hospital Stay: Attending: Internal Medicine

## 2024-03-13 VITALS — BP 134/72 | HR 76 | Temp 98.0°F | Resp 12 | Ht 64.0 in | Wt 305.8 lb

## 2024-03-13 DIAGNOSIS — Z7985 Long-term (current) use of injectable non-insulin antidiabetic drugs: Secondary | ICD-10-CM | POA: Diagnosis not present

## 2024-03-13 DIAGNOSIS — Z79811 Long term (current) use of aromatase inhibitors: Secondary | ICD-10-CM | POA: Diagnosis not present

## 2024-03-13 DIAGNOSIS — N644 Mastodynia: Secondary | ICD-10-CM | POA: Diagnosis not present

## 2024-03-13 DIAGNOSIS — Z87891 Personal history of nicotine dependence: Secondary | ICD-10-CM | POA: Diagnosis not present

## 2024-03-13 DIAGNOSIS — R1013 Epigastric pain: Secondary | ICD-10-CM

## 2024-03-13 DIAGNOSIS — Z170 Estrogen receptor positive status [ER+]: Secondary | ICD-10-CM | POA: Insufficient documentation

## 2024-03-13 DIAGNOSIS — C50312 Malignant neoplasm of lower-inner quadrant of left female breast: Secondary | ICD-10-CM | POA: Insufficient documentation

## 2024-03-13 DIAGNOSIS — E119 Type 2 diabetes mellitus without complications: Secondary | ICD-10-CM | POA: Insufficient documentation

## 2024-03-13 LAB — CBC WITH DIFFERENTIAL (CANCER CENTER ONLY)
Abs Immature Granulocytes: 0.03 K/uL (ref 0.00–0.07)
Basophils Absolute: 0.1 K/uL (ref 0.0–0.1)
Basophils Relative: 1 %
Eosinophils Absolute: 0.1 K/uL (ref 0.0–0.5)
Eosinophils Relative: 2 %
HCT: 41.7 % (ref 36.0–46.0)
Hemoglobin: 13.9 g/dL (ref 12.0–15.0)
Immature Granulocytes: 0 %
Lymphocytes Relative: 25 %
Lymphs Abs: 1.8 K/uL (ref 0.7–4.0)
MCH: 30.7 pg (ref 26.0–34.0)
MCHC: 33.3 g/dL (ref 30.0–36.0)
MCV: 92.1 fL (ref 80.0–100.0)
Monocytes Absolute: 0.5 K/uL (ref 0.1–1.0)
Monocytes Relative: 7 %
Neutro Abs: 4.7 K/uL (ref 1.7–7.7)
Neutrophils Relative %: 65 %
Platelet Count: 204 K/uL (ref 150–400)
RBC: 4.53 MIL/uL (ref 3.87–5.11)
RDW: 12.5 % (ref 11.5–15.5)
WBC Count: 7.2 K/uL (ref 4.0–10.5)
nRBC: 0 % (ref 0.0–0.2)

## 2024-03-13 LAB — VITAMIN D 25 HYDROXY (VIT D DEFICIENCY, FRACTURES): Vit D, 25-Hydroxy: 23.98 ng/mL — ABNORMAL LOW (ref 30–100)

## 2024-03-13 LAB — CMP (CANCER CENTER ONLY)
ALT: 22 U/L (ref 0–44)
AST: 22 U/L (ref 15–41)
Albumin: 3.8 g/dL (ref 3.5–5.0)
Alkaline Phosphatase: 92 U/L (ref 38–126)
Anion gap: 8 (ref 5–15)
BUN: 17 mg/dL (ref 8–23)
CO2: 23 mmol/L (ref 22–32)
Calcium: 9 mg/dL (ref 8.9–10.3)
Chloride: 109 mmol/L (ref 98–111)
Creatinine: 0.96 mg/dL (ref 0.44–1.00)
GFR, Estimated: >60 mL/min (ref >60)
Glucose, Bld: 185 mg/dL — ABNORMAL HIGH (ref 70–99)
Potassium: 3.6 mmol/L (ref 3.5–5.1)
Sodium: 140 mmol/L (ref 135–145)
Total Bilirubin: 1.4 mg/dL — ABNORMAL HIGH (ref 0.0–1.2)
Total Protein: 6.9 g/dL (ref 6.5–8.1)

## 2024-03-13 LAB — LIPASE, BLOOD: Lipase: 30 U/L (ref 11–51)

## 2024-03-13 LAB — AMYLASE: Amylase: 39 U/L (ref 28–100)

## 2024-03-13 NOTE — Progress Notes (Signed)
 C/o being tired all the time. Pain in b/l breast area, comes and goes. C/o pain in upper mid/right abdomen.

## 2024-03-13 NOTE — Assessment & Plan Note (Addendum)
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 Currently on Aromasin -tolerating Aromasin  well [until spring 2028]  Prescription refilled for 90 days.  UNI RIGHT MAMMOGRAM- oct 2024- WNL. Stable. Pending now- see below.   # RIGHT BREAST Pain- no masses felt. again due for mammo- now.   # Upper mid/right abdomen- hurts for 5 mins. No aggravating or relenting factors.  ? Related to ozempic-  off Ozempic x 2 months.- Recommend Prilosec or Nexium over the counter- in AM prior to breakfast. 60 minutes prior to meal.  Sitting upright for 2 to 3 hours post a meal.  Also. Recommend TUMS [over the counter]. Recommend GI referral- [facility] re: epigastric pain. HOLD off any imaging at this time. order amylase/lipase-   # Hot flashes-grade -improved-  stable.   # Screening for osteoprosis: FEB 2023- BMD T-score of 0.0. continue ca+vitD BID. Stab;le.SEP 2025- Normal based on BMD.   # Diabetes/weight loss: PBF-114; on Ozempic weekly; mild weight loss-  [in last 6-8 monthsCharles Drew]; recommend dietary discretion. Defer to PCP.  # DISPOSITION: # Recommend GI referral- [facility] re: epigastric pain # order amylase/lipase- epigastric pain # follow up in 6 month; MD;labs- cbc/cmp; Vit D 25-OH - - Dr.B

## 2024-03-13 NOTE — Progress Notes (Signed)
 [facility] CONSULT NOTE  Patient Care Team: Center, [facility] as PCP - General (General Practice) Cindie Jesusa HERO, RN as Registered Nurse Dannielle, Arlean FALCON, RN (Inactive) as Registered Nurse Dannielle Arlean FALCON, RN (Inactive) as Oncology Nurse Navigator Rennie Cindy SAUNDERS, MD as Consulting Physician (Oncology) Dessa Reyes ORN, MD as Consulting Physician (General Surgery)  CHIEF COMPLAINTS/PURPOSE OF CONSULTATION: Breast cancer  #  Oncology History Overview Note  DIAGNOSIS:  A. LEFT BREAST, 8:30; ULTRASOUND-GUIDED BIOPSY:  - INVASIVE MAMMARY CARCINOMA, NO SPECIAL TYPE.   Size of invasive carcinoma: 4 mm in this sample  Histologic grade of invasive carcinoma: Grade 2                       Glandular/tubular differentiation score: 3                       Nuclear pleomorphism score: 2                       Mitotic rate score: 1                       Total score: 6  Ductal carcinoma in situ: Not identified  Lymphovascular invasion: Not identified  PATHOLOGIC STAGE CLASSIFICATION (pTNM, AJCC 8th Edition):  TNM Descriptors: Not applicable  pT Category: pT1b  Regional Lymph Nodes Modifier:  pN Category: pN0  pM Category: Not applicable   SPECIAL STUDIES  Breast Biomarker Testing Performed on Previous Biopsy: [id number]  Estrogen Receptor (ER) Status: POSITIVE          Percentage of cells with nuclear positivity: 91-100%          Average intensity of staining: Strong   Progesterone Receptor (PgR) Status: POSITIVE          Percentage of cells with nuclear positivity: 81-90%          Average intensity of staining: Moderate   HER2 (by immunohistochemistry): NEGATIVE (Score 0)  Ki-67: Not performed  JAN 2023- pT1b:G-1 [s/p mastectomy- pt pref; Dr.Byrnett]; no Oncotype; no adjuvant radiation- FEB 2023-anastrozole ; STOPPED in SEP SEP 2023-severe hot flashes see/fatigue. 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 With daughter.   Niels ORN Sans 63 y.o.  female stage I ER/PR positive HER2 negative left-sided breast cancer is here s/p mastectomy currently  on aromasin  is here for follow-up.  C/o being tired all the time. Pain in b/l breast area, comes and goes. C/o pain in upper mid/right abdomen- hurts for 5 mins. No aggravating or relenting factors.  ? Related to ozempic-  off Ozempic x 2 months. Not improved- nut not resolved. No nausea or vomiting.   Patient continues to have hot flashes-overall improved. Mild  fatigue.  However symptoms are tolerable on Aromasin .    Review of Systems  Constitutional:  Negative for chills, diaphoresis, fever, malaise/fatigue and weight loss.  HENT:  Negative for nosebleeds and sore throat.   Eyes:  Negative for double vision.  Respiratory:  Negative for cough, hemoptysis, sputum production, shortness of breath and wheezing.   Cardiovascular:  Negative for chest pain, palpitations, orthopnea and leg swelling.  Gastrointestinal:  Negative for abdominal pain, blood in stool, constipation, diarrhea, heartburn, melena, nausea and vomiting.  Genitourinary:  Negative for dysuria, frequency and urgency.  Musculoskeletal:  Positive for joint pain. Negative for back pain.  Skin: Negative.  Negative for itching and rash.  Neurological:  Negative for dizziness, tingling, focal weakness, weakness and headaches.  Endo/Heme/Allergies:  Does not bruise/bleed easily.  Psychiatric/Behavioral:  Negative for depression. The patient is not  nervous/anxious and does not have insomnia.      MEDICAL HISTORY:  Past Medical History:  Diagnosis Date   Arthritis    Cancer (HCC)    left breast   COPD (chronic obstructive pulmonary disease) (HCC)    Diabetes mellitus without complication (HCC)    Dyspnea    Epilepsy (HCC)    no siezures in 43 years   Pneumonia    possibly beginning of November 2022-pt states md did not do cxr but was treating it as pneumonia   Sleep apnea    could not tolerate cpap    SURGICAL HISTORY: Past Surgical History:  Procedure Laterality Date   ABDOMINAL HYSTERECTOMY     APPENDECTOMY     BREAST BIOPSY Right    benign 15 years ago   BREAST BIOPSY Left 05/05/2021   stereo bx-calcs/RIBBON clip-path pending   CHOLECYSTECTOMY     COLONOSCOPY     JOINT REPLACEMENT Bilateral    2005 and 2018 knees   SIMPLE MASTECTOMY WITH AXILLARY SENTINEL NODE BIOPSY Left 05/26/2021   Procedure: SIMPLE MASTECTOMY WITH AXILLARY SENTINEL NODE BIOPSY;  Surgeon: Dessa Reyes ORN, MD;  Location: [facility] ORS;  Service: General;  Laterality: Left;    SOCIAL HISTORY: Social History   Socioeconomic History   Marital status: Married    Spouse name: Not on file   Number of children: Not on file   Years of education: Not on file   Highest education level: Not on file  Occupational History   Not on file  Tobacco Use   Smoking status: Former    Current packs/day: 0.00    Average packs/day: 1 pack/day for 15.0 years (15.0 ttl pk-yrs)    Types: Cigarettes    Start date: 40    Quit date: 2002    Years since quitting: 23.7   Smokeless tobacco: Never   Tobacco comments:    quit 20 years ago  Vaping Use   Vaping status: Never Used  Substance and Sexual Activity   Alcohol use: Not Currently   Drug use: Not Currently   Sexual activity: Not Currently  Other Topics Concern   Not on file  Social History Narrative   Patient lives in home with husband. Children and husband will be there to help out.   Feels safe  in her home.  Does not work outside of home.   Social Drivers of Corporate investment banker Strain: Not on file  Food Insecurity: Not on file  Transportation Needs: Not on file  Physical Activity: Not on file  Stress: Not on file  Social Connections: Not on file  Intimate Partner Violence: Not on file    FAMILY HISTORY: Family History  Problem Relation Age of Onset   Breast cancer Maternal Aunt 60    ALLERGIES:  is allergic to vicodin [hydrocodone-acetaminophen ].  MEDICATIONS:  Current Outpatient Medications  Medication Sig Dispense Refill   acetaminophen  (TYLENOL ) 500 MG tablet Take 1,000 mg by mouth every 6 (six) hours as needed for moderate pain.     albuterol  (VENTOLIN  HFA) 108 (90 Base) MCG/ACT inhaler Inhale 2 puffs into the lungs every 6 (six) hours as needed for wheezing or shortness of breath.     atorvastatin (LIPITOR) 20 MG tablet Take 20 mg by mouth daily.     ergocalciferol  (VITAMIN D2) 1.25 MG (50000 UT) capsule Take 1 capsule (50,000 Units total) by mouth once a week. 14 capsule 1   exemestane  (AROMASIN ) 25 MG tablet TAKE 1 TABLET BY MOUTH ONCE DAILY AFTER BREAKFAST 90 tablet 0   fluticasone (FLOVENT HFA) 220 MCG/ACT inhaler Inhale 2 puffs into the lungs 2 (two) times daily.     gabapentin (NEURONTIN) 300 MG capsule Take 300 mg by mouth at bedtime.     JARDIANCE 10 MG TABS tablet Take 25 mg by mouth daily.     loratadine (CLARITIN) 10 MG tablet Take 10 mg by mouth daily as needed for allergies.     sertraline (ZOLOFT) 100 MG tablet Take 150 mg by mouth at bedtime.     No current facility-administered medications for this visit.      SABRA  PHYSICAL EXAMINATION: ECOG PERFORMANCE STATUS: 0 - Asymptomatic  Vitals:   03/13/24 0919  BP: 134/72  Pulse: 76  Resp: 12  Temp: 98 F (36.7 C)  SpO2: 99%   Filed Weights   03/13/24 0919  Weight: (!) 305 lb 12.5 oz (138.7 kg)   Left mastectomy.  Drain in place. Physical Exam Vitals and nursing note reviewed.   Constitutional:      Comments:     HENT:     Head: Normocephalic and atraumatic.     Mouth/Throat:     Mouth: Mucous membranes are moist.     Pharynx: No oropharyngeal exudate.  Eyes:     Pupils: Pupils are equal, round, and reactive to light.  Cardiovascular:     Rate and Rhythm: Normal rate and regular rhythm.  Pulmonary:     Effort: No respiratory distress.     Breath sounds: No wheezing.     Comments: Decreased breath sounds bilaterally at bases.  No wheeze or crackles Abdominal:     General: Bowel sounds are normal. There is no distension.     Palpations: Abdomen is soft. There is no mass.     Tenderness: There is no abdominal tenderness. There is no guarding or rebound.  Musculoskeletal:        General: No tenderness. Normal range of motion.     Cervical back: Normal range of motion and neck supple.  Skin:    General: Skin is warm.  Neurological:     Mental Status: She is alert and oriented to person, place, and time.  Psychiatric:        Mood and Affect: Affect normal.        Judgment: Judgment normal.      LABORATORY DATA:  I have reviewed the data as listed Lab Results  Component Value Date   WBC 7.2 03/13/2024   HGB 13.9 03/13/2024   HCT 41.7 03/13/2024   MCV 92.1 03/13/2024   PLT 204 03/13/2024   Recent Labs    07/16/23 1013 03/13/24 0920  NA 141 140  K 4.2 3.6  CL 107 109  CO2 26 23  GLUCOSE 114* 185*  BUN 20 17  CREATININE 0.81 0.96  CALCIUM 9.3 9.0  GFRNONAA >60 >60  PROT 7.2 6.9  ALBUMIN 3.8 3.8  AST 20 22  ALT 25 22  ALKPHOS 83 92  BILITOT 1.5* 1.4*    RADIOGRAPHIC STUDIES: I have personally reviewed the radiological images as listed and agreed with the findings in the report. DG Bone Density Result Date: 02/15/2024 EXAM: DUAL X-RAY ABSORPTIOMETRY (DXA) FOR BONE MINERAL DENSITY 02/15/2024 1:36 pm CLINICAL DATA:  62 year old Female Postmenopausal. History of breast cancer TECHNIQUE: An axial (e.g., hips, spine) and/or appendicular  (e.g., radius) exam was performed, as appropriate, using GE Secretary/administrator at [facility]. Images are obtained for bone mineral density measurement and are not obtained for diagnostic purposes. [id number] Exclusions: L1 due to degenerative changes. COMPARISON:  08/12/2021. FINDINGS: Scan quality: Good. LUMBAR SPINE (L2-L4): BMD (in g/cm2): 1.213 T-score: 0.0 Z-score: 1.3 Rate of change from previous exam: No significant rate of change from previous exam. LEFT FEMORAL NECK: BMD (in g/cm2): 1.121 T-score: 0.6 Z-score: 1.9 LEFT TOTAL HIP: BMD (in g/cm2): 1.136 T-score: 1.0 Z-score: 2.1 RIGHT FEMORAL NECK: BMD (in g/cm2): 1.126 T-score: 0.6 Z-score: 2.0 RIGHT TOTAL HIP: BMD (in g/cm2): 1.177 T-score: 1.3 Z-score: 2.4 DUAL-FEMUR TOTAL MEAN: Rate of change from previous exam: -7.9 % FRAX 10-YEAR PROBABILITY OF FRACTURE: FRAX not reported as the lowest BMD is not in the osteopenia range. IMPRESSION: Normal based on BMD. Fracture risk is not increased. RECOMMENDATIONS: 1. All patients should optimize calcium and vitamin D  intake. 2. Consider FDA-approved medical therapies in postmenopausal women and men aged 4 years and older, based on the following: - A hip or vertebral (clinical or morphometric) fracture - T-score less than or equal to -2.5 and secondary causes have been excluded. - Low bone mass (T-score between -1.0 and -2.5) and a 10-year probability of a hip fracture greater than or equal to 3% or a 10-year probability of a major osteoporosis-related fracture greater than or equal to 20% based on the [place] -adapted WHO algorithm. - Clinician judgment and/or patient preferences may indicate treatment for people with 10-year fracture probabilities above or below these levels 3. Patients with diagnosis of osteoporosis or at high risk for fracture should have regular bone mineral density tests. For patients eligible for Medicare, routine testing is allowed once every 2 years. The testing  frequency can be increased to one year for patients who have rapidly progressing disease, those who are receiving or discontinuing medical therapy to restore bone mass, or have additional risk factors. Electronically Signed   By: Reyes Phi M.D.   On: 02/15/2024 17:26    ASSESSMENT & PLAN:   Carcinoma of lower-inner quadrant of left breast in female, estrogen receptor positive (HCC) # JAN 2023-pT1b G-1;  8 mm-invasive ductal cancer-ER/PR positive HER2 negative- s/p mastectomy [Dr.Byrnett]. Currently on Aromasin -tolerating Aromasin  well [until spring 2028]  Prescription refilled for 90 days.  UNI RIGHT MAMMOGRAM- oct 2024- WNL. Stable. Pending now- see below.   # RIGHT BREAST Pain- no masses felt. again due for mammo- now.   # Upper mid/right abdomen- hurts for 5 mins. No aggravating or relenting factors.  ? Related to ozempic-  off Ozempic x 2 months.- Recommend Prilosec or Nexium over the counter- in AM prior to breakfast. 60 minutes prior to meal.  Sitting upright for 2 to 3 hours post a meal.  Also. Recommend TUMS [over the counter]. Recommend GI referral- [facility] re: epigastric pain. HOLD off any imaging at this time. order amylase/lipase-   # Hot flashes-grade -improved-  stable.   #  Screening for osteoprosis: FEB 2023- BMD T-score of 0.0. continue ca+vitD BID. Stab;le.SEP 2025- Normal based on BMD.   # Diabetes/weight loss: PBF-114; on Ozempic weekly; mild weight loss-  [in last 6-8 monthsCharles Drew]; recommend dietary discretion. Defer to PCP.  # DISPOSITION: # Recommend GI referral- [facility] re: epigastric pain # order amylase/lipase- epigastric pain # follow up in 6 month; MD;labs- cbc/cmp; Vit D 25-OH - - Dr.B    All questions were answered. The patient/family knows to call the clinic with any problems, questions or concerns.    Cindy JONELLE Joe, MD 03/13/2024 10:48 AM

## 2024-03-13 NOTE — Patient Instructions (Signed)
#   Recommend Prilosec or Nexium over the counter- in AM prior to breakfast. 60 minutes prior to meal.  Sitting upright for 2 to 3 hours post a meal.  Also. Recommend TUMS [over the counter]

## 2024-03-14 ENCOUNTER — Other Ambulatory Visit: Payer: Self-pay | Admitting: Internal Medicine

## 2024-03-14 DIAGNOSIS — Z1231 Encounter for screening mammogram for malignant neoplasm of breast: Secondary | ICD-10-CM

## 2024-03-16 ENCOUNTER — Ambulatory Visit: Payer: Self-pay | Admitting: Internal Medicine

## 2024-03-17 ENCOUNTER — Other Ambulatory Visit: Payer: Self-pay | Admitting: Internal Medicine

## 2024-03-17 ENCOUNTER — Other Ambulatory Visit: Payer: Self-pay

## 2024-03-17 DIAGNOSIS — R1013 Epigastric pain: Secondary | ICD-10-CM

## 2024-03-17 DIAGNOSIS — E559 Vitamin D deficiency, unspecified: Secondary | ICD-10-CM

## 2024-03-17 MED ORDER — ERGOCALCIFEROL 1.25 MG (50000 UT) PO CAPS: 50000.0000 [IU] | ORAL_CAPSULE | ORAL | 1 refills | Status: AC

## 2024-03-17 NOTE — Progress Notes (Signed)
 Pt notified, refill sent in

## 2024-03-20 ENCOUNTER — Telehealth: Payer: Self-pay | Admitting: *Deleted

## 2024-03-20 ENCOUNTER — Ambulatory Visit
Admission: RE | Admit: 2024-03-20 | Discharge: 2024-03-20 | Disposition: A | Discharge status: Home / Self Care | Source: Ambulatory Visit | Attending: Internal Medicine | Admitting: Internal Medicine

## 2024-03-20 ENCOUNTER — Other Ambulatory Visit: Payer: Self-pay

## 2024-03-20 DIAGNOSIS — R1013 Epigastric pain: Secondary | ICD-10-CM | POA: Insufficient documentation

## 2024-03-20 DIAGNOSIS — C50312 Malignant neoplasm of lower-inner quadrant of left female breast: Secondary | ICD-10-CM

## 2024-03-20 NOTE — Telephone Encounter (Signed)
 Last mammogram was 04/15/23.  Ok to order?

## 2024-03-20 NOTE — Telephone Encounter (Signed)
 Order is in.

## 2024-03-20 NOTE — Telephone Encounter (Signed)
 The patient wanted to get mammogram and when she called to get it set up , she was told that there is no order for it.

## 2024-03-21 ENCOUNTER — Other Ambulatory Visit

## 2024-03-21 ENCOUNTER — Ambulatory Visit: Admitting: Internal Medicine

## 2024-03-28 ENCOUNTER — Ambulatory Visit: Admitting: Anesthesiology

## 2024-03-28 ENCOUNTER — Encounter: Payer: Self-pay | Admitting: Gastroenterology

## 2024-03-28 ENCOUNTER — Ambulatory Visit
Admission: RE | Admit: 2024-03-28 | Discharge: 2024-03-28 | Disposition: A | Discharge status: Home / Self Care | Attending: Gastroenterology | Admitting: Gastroenterology

## 2024-03-28 ENCOUNTER — Encounter
Admission: RE | Disposition: A | Discharge status: Home / Self Care | Payer: Self-pay | Source: Home / Self Care | Attending: Gastroenterology

## 2024-03-28 DIAGNOSIS — Z7984 Long term (current) use of oral hypoglycemic drugs: Secondary | ICD-10-CM | POA: Insufficient documentation

## 2024-03-28 DIAGNOSIS — D123 Benign neoplasm of transverse colon: Secondary | ICD-10-CM | POA: Insufficient documentation

## 2024-03-28 DIAGNOSIS — B9681 Helicobacter pylori [H. pylori] as the cause of diseases classified elsewhere: Secondary | ICD-10-CM | POA: Insufficient documentation

## 2024-03-28 DIAGNOSIS — Z87891 Personal history of nicotine dependence: Secondary | ICD-10-CM | POA: Insufficient documentation

## 2024-03-28 DIAGNOSIS — J449 Chronic obstructive pulmonary disease, unspecified: Secondary | ICD-10-CM | POA: Insufficient documentation

## 2024-03-28 DIAGNOSIS — Z1211 Encounter for screening for malignant neoplasm of colon: Secondary | ICD-10-CM | POA: Diagnosis present

## 2024-03-28 DIAGNOSIS — K3189 Other diseases of stomach and duodenum: Secondary | ICD-10-CM | POA: Insufficient documentation

## 2024-03-28 DIAGNOSIS — K2970 Gastritis, unspecified, without bleeding: Secondary | ICD-10-CM | POA: Insufficient documentation

## 2024-03-28 DIAGNOSIS — G40909 Epilepsy, unspecified, not intractable, without status epilepticus: Secondary | ICD-10-CM | POA: Insufficient documentation

## 2024-03-28 DIAGNOSIS — Z5941 Food insecurity: Secondary | ICD-10-CM | POA: Insufficient documentation

## 2024-03-28 DIAGNOSIS — Z59868 Other specified financial insecurity: Secondary | ICD-10-CM | POA: Insufficient documentation

## 2024-03-28 DIAGNOSIS — E119 Type 2 diabetes mellitus without complications: Secondary | ICD-10-CM | POA: Diagnosis not present

## 2024-03-28 DIAGNOSIS — D124 Benign neoplasm of descending colon: Secondary | ICD-10-CM | POA: Diagnosis not present

## 2024-03-28 DIAGNOSIS — G4730 Sleep apnea, unspecified: Secondary | ICD-10-CM | POA: Diagnosis not present

## 2024-03-28 DIAGNOSIS — D122 Benign neoplasm of ascending colon: Secondary | ICD-10-CM | POA: Insufficient documentation

## 2024-03-28 DIAGNOSIS — I10 Essential (primary) hypertension: Secondary | ICD-10-CM | POA: Diagnosis not present

## 2024-03-28 HISTORY — PX: POLYPECTOMY: SHX149

## 2024-03-28 HISTORY — PX: HEMOSTASIS CLIP PLACEMENT: SHX6857

## 2024-03-28 HISTORY — PX: COLONOSCOPY: SHX5424

## 2024-03-28 HISTORY — PX: ESOPHAGOGASTRODUODENOSCOPY: SHX5428

## 2024-03-28 LAB — GLUCOSE, CAPILLARY: Glucose-Capillary: 185 mg/dL — ABNORMAL HIGH (ref 70–99)

## 2024-03-28 SURGERY — COLONOSCOPY
Anesthesia: General

## 2024-03-28 MED ORDER — PROPOFOL 1000 MG/100ML IV EMUL
INTRAVENOUS | Status: AC
  Filled 2024-03-28: qty 100

## 2024-03-28 MED ORDER — PROPOFOL 500 MG/50ML IV EMUL
INTRAVENOUS | Status: DC | PRN
  Administered 2024-03-28: 150 ug/kg/min via INTRAVENOUS

## 2024-03-28 MED ORDER — PROPOFOL 10 MG/ML IV BOLUS
INTRAVENOUS | Status: DC | PRN
  Administered 2024-03-28: 50 mg via INTRAVENOUS
  Administered 2024-03-28: 100 mg via INTRAVENOUS

## 2024-03-28 MED ORDER — SODIUM CHLORIDE 0.9 % IV SOLN: INTRAVENOUS | Status: DC

## 2024-03-28 MED ORDER — LIDOCAINE HCL (CARDIAC) PF 100 MG/5ML IV SOSY
PREFILLED_SYRINGE | INTRAVENOUS | Status: DC | PRN
  Administered 2024-03-28: 100 mg via INTRAVENOUS

## 2024-03-28 MED ORDER — EPHEDRINE 5 MG/ML INJ
INTRAVENOUS | Status: AC
  Filled 2024-03-28: qty 5

## 2024-03-28 MED ORDER — EPHEDRINE SULFATE-NACL 50-0.9 MG/10ML-% IV SOSY
PREFILLED_SYRINGE | INTRAVENOUS | Status: DC | PRN
  Administered 2024-03-28 (×2): 10 mg via INTRAVENOUS

## 2024-03-28 MED ORDER — LIDOCAINE HCL (PF) 2 % IJ SOLN
INTRAMUSCULAR | Status: AC
  Filled 2024-03-28: qty 5

## 2024-03-28 NOTE — Op Note (Signed)
 [facility] Patient Name: Cheryl Silva Procedure Date: 03/28/2024 8:59 AM MRN: [id number] Account #: [id number] Date of Birth: May 21, 1961 Admit Type: Outpatient Age: 63 Room: [facility] ENDO ROOM 2 Gender: Female Note Status: Finalized Instrument Name: Colon Scope [id number] Procedure:             Colonoscopy Indications:           Surveillance: Personal history of adenomatous polyps                         on last colonoscopy > 3 years ago Providers:             Ruel Kung MD, MD Referring MD:          No Local Md, MD (Referring MD) Medicines:             Monitored Anesthesia Care Complications:         No immediate complications. Procedure:             Pre-Anesthesia Assessment:                        - Prior to the procedure, a History and Physical was                         performed, and patient medications, allergies and                         sensitivities were reviewed. The patient's tolerance                         of previous anesthesia was reviewed.                        - The risks and benefits of the procedure and the                         sedation options and risks were discussed with the                         patient. All questions were answered and informed                         consent was obtained.                        - ASA Grade Assessment: II - A patient with mild                         systemic disease.                        After obtaining informed consent, the colonoscope was                         passed under direct vision. Throughout the procedure,                         the patient's blood pressure, pulse, and oxygen  saturations were monitored continuously. The                         Colonoscope was introduced through the anus and                         advanced to the the cecum, identified by the                         appendiceal orifice. The colonoscopy was performed                          with ease. The patient tolerated the procedure well.                         The quality of the bowel preparation was adequate. The                         ileocecal valve, appendiceal orifice, and rectum were                         photographed. Findings:      The perianal and digital rectal examinations were normal.      Two sessile polyps were found in the ascending colon. The polyps were 5       to 6 mm in size. These polyps were removed with a cold snare. Resection       and retrieval were complete.      A 17 mm polyp was found in the descending colon. The polyp was sessile.       The polyp was removed with a piecemeal technique using a cold snare.       Resection and retrieval were complete. To prevent bleeding after the       polypectomy, three hemostatic clips were successfully placed. Clip       manufacturer: [employer]. There was no bleeding at the end of the       procedure.      A 20 mm polyp was found in the transverse colon. The polyp was sessile.       The polyp was removed with a piecemeal technique using a cold snare.       Resection and retrieval were complete. To prevent bleeding after the       polypectomy, three hemostatic clips were successfully placed. Clip       manufacturer: [employer]. There was no bleeding at the end of the       procedure.      The exam was otherwise without abnormality on direct and retroflexion       views. Impression:            - Two 5 to 6 mm polyps in the ascending colon, removed                         with a cold snare. Resected and retrieved.                        - One 17 mm polyp in the descending colon, removed  piecemeal using a cold snare. Resected and retrieved.                         Clips were placed. Clip manufacturer: [employer].                        - One 20 mm polyp in the transverse colon, removed                         piecemeal using a  cold snare. Resected and retrieved.                         Clips were placed. Clip manufacturer: [employer].                        - The examination was otherwise normal on direct and                         retroflexion views. Recommendation:        - Discharge patient to home (with escort).                        - Resume previous diet.                        - Continue present medications.                        - Await pathology results.                        - Repeat colonoscopy in 6 months for surveillance                         after piecemeal polypectomy. Procedure Code(s):     --- Professional ---                        [id number], Colonoscopy, flexible; with removal of                         tumor(s), polyp(s), or other lesion(s) by snare                         technique Diagnosis Code(s):     --- Professional ---                        Z86.010, Personal history of colonic polyps                        D12.2, Benign neoplasm of ascending colon                        D12.4, Benign neoplasm of descending colon  D12.3, Benign neoplasm of transverse colon (hepatic                         flexure or splenic flexure) CPT copyright 2022 American Medical Association. All rights reserved. The codes documented in this report are preliminary and upon coder review may  be revised to meet current compliance requirements. Ruel Kung, MD Ruel Kung MD, MD 03/28/2024 9:50:17 AM This report has been signed electronically. Number of Addenda: 0 Note Initiated On: 03/28/2024 8:59 AM Scope Withdrawal Time: 0 hours 29 minutes 33 seconds  Total Procedure Duration: 0 hours 30 minutes 54 seconds  Estimated Blood Loss:  Estimated blood loss: none.      [facility]

## 2024-03-28 NOTE — H&P (Signed)
 Ruel Kung , MD [address], [address], [place], [place], [address] Phone: [phone] Fax: [phone]  Primary Care Physician:  [facility]   Pre-Procedure History & Physical: HPI:  Cheryl Silva is a 63 y.o. female is here for an endoscopy and colonoscopy    Past Medical History:  Diagnosis Date   Arthritis    Cancer [facility])    left breast   COPD (chronic obstructive pulmonary disease) (HCC)    Diabetes mellitus without complication (HCC)    Dyspnea    Epilepsy (HCC)    no siezures in 43 years   Pneumonia    possibly beginning of November 2022-pt states md did not do cxr but was treating it as pneumonia   Sleep apnea    could not tolerate cpap    Past Surgical History:  Procedure Laterality Date   ABDOMINAL HYSTERECTOMY     APPENDECTOMY     BREAST BIOPSY Right    benign 15 years ago   BREAST BIOPSY Left 05/05/2021   stereo bx-calcs/RIBBON clip-path pending   CHOLECYSTECTOMY     COLONOSCOPY     JOINT REPLACEMENT Bilateral    2005 and 2018 knees   SIMPLE MASTECTOMY WITH AXILLARY SENTINEL NODE BIOPSY Left 05/26/2021   Procedure: SIMPLE MASTECTOMY WITH AXILLARY SENTINEL NODE BIOPSY;  Surgeon: Dessa Reyes LELON, MD;  Location: [facility] ORS;  Service: General;  Laterality: Left;    Prior to Admission medications   Medication Sig Start Date End Date Taking? Authorizing Provider  acetaminophen  (TYLENOL ) 500 MG tablet Take 1,000 mg by mouth every 6 (six) hours as needed for moderate pain.    [provider]  albuterol  (VENTOLIN  HFA) 108 (90 Base) MCG/ACT inhaler Inhale 2 puffs into the lungs every 6 (six) hours as needed for wheezing or shortness of breath.    [provider]  atorvastatin (LIPITOR) 20 MG tablet Take 20 mg by mouth daily. 08/20/21   [provider]  ergocalciferol  (VITAMIN D2) 1.25 MG (50000 UT) capsule Take 1 capsule (50,000 Units total) by mouth once a week. 03/17/24   Brahmanday, Govinda R, MD   exemestane  (AROMASIN ) 25 MG tablet TAKE 1 TABLET BY MOUTH ONCE DAILY AFTER BREAKFAST 01/10/24   Brahmanday, Govinda R, MD  fluticasone (FLOVENT HFA) 220 MCG/ACT inhaler Inhale 2 puffs into the lungs 2 (two) times daily.    [provider]  gabapentin (NEURONTIN) 300 MG capsule Take 300 mg by mouth at bedtime. 03/21/19   [provider]  JARDIANCE 10 MG TABS tablet Take 25 mg by mouth daily. 05/24/20   [provider]  loratadine (CLARITIN) 10 MG tablet Take 10 mg by mouth daily as needed for allergies.    [provider]  sertraline (ZOLOFT) 100 MG tablet Take 150 mg by mouth at bedtime.    [provider]    Allergies as of 03/17/2024 - Review Complete 03/13/2024  Allergen Reaction Noted   Vicodin [hydrocodone-acetaminophen ] Itching 06/20/2020    Family History  Problem Relation Age of Onset   Breast cancer Maternal Aunt 26    Social History   Socioeconomic History   Marital status: Married    Spouse name: Not on file   Number of children: Not on file   Years of education: Not on file   Highest education level: Not on file  Occupational History   Not on file  Tobacco Use   Smoking status: Former    Current packs/day: 0.00  Average packs/day: 1 pack/day for 15.0 years (15.0 ttl pk-yrs)    Types: Cigarettes    Start date: 11    Quit date: 2002    Years since quitting: 23.8   Smokeless tobacco: Never   Tobacco comments:    quit 20 years ago  Vaping Use   Vaping status: Never Used  Substance and Sexual Activity   Alcohol use: Not Currently   Drug use: Not Currently   Sexual activity: Not Currently  Other Topics Concern   Not on file  Social History Narrative   Patient lives in home with husband. Children and husband will be there to help out.   Feels safe in her home.  Does not work outside of home.   Social Drivers of Corporate investment banker Strain: Medium Risk (03/15/2024)   Received from [facility]   Overall Financial Resource Strain (CARDIA)    Difficulty of Paying Living Expenses: Somewhat hard  Food Insecurity: Food Insecurity Present (03/15/2024)   Received from [facility]   Hunger Vital Sign    Within the past 12 months, you worried that your food would run out before you got the money to buy more.: Sometimes true    Within the past 12 months, the food you bought just didn't last and you didn't have money to get more.: Sometimes true  Transportation Needs: No Transportation Needs (03/15/2024)   Received from [facility] - Transportation    In the past 12 months, has lack of transportation kept you from medical appointments or from getting medications?: No    Lack of Transportation (Non-Medical): No  Physical Activity: Not on file  Stress: Not on file  Social Connections: Not on file  Intimate Partner Violence: Not on file    Review of Systems: See HPI, otherwise negative ROS  Physical Exam: There were no vitals taken for this visit. General:   Alert,  pleasant and cooperative in NAD Head:  Normocephalic and atraumatic. Neck:  Supple; no masses or thyromegaly. Lungs:  Clear throughout to auscultation, normal respiratory effort.    Heart:  +S1, +S2, Regular rate and rhythm, No edema. Abdomen:  Soft, nontender and nondistended. Normal bowel sounds, without guarding, and without rebound.   Neurologic:  Alert and  oriented x4;  grossly normal neurologically.  Impression/Plan: Cheryl Silva is here for an endoscopy and colonoscopy  to be performed for  evaluation of abdominal pain and personal history of colon polyps    Risks, benefits, limitations, and alternatives regarding endoscopy have been reviewed with the patient.  Questions have been answered.  All parties agreeable.   Ruel Kung, MD  03/28/2024, 8:27 AM

## 2024-03-28 NOTE — Op Note (Signed)
 [facility] Patient Name: Cheryl Silva Procedure Date: 03/28/2024 8:59 AM MRN: [id number] Account #: [id number] Date of Birth: 05-25-1961 Admit Type: Outpatient Age: 63 Room: [facility] ENDO ROOM 2 Gender: Female Note Status: Finalized Instrument Name: Upper GI Scope [id number] Procedure:             Upper GI endoscopy Indications:           Abdominal pain Providers:             Ruel Kung MD, MD Referring MD:          No Local Md, MD (Referring MD) Medicines:             Monitored Anesthesia Care Complications:         No immediate complications. Procedure:             Pre-Anesthesia Assessment:                        - Prior to the procedure, a History and Physical was                         performed, and patient medications, allergies and                         sensitivities were reviewed. The patient's tolerance                         of previous anesthesia was reviewed.                        - The risks and benefits of the procedure and the                         sedation options and risks were discussed with the                         patient. All questions were answered and informed                         consent was obtained.                        - ASA Grade Assessment: II - A patient with mild                         systemic disease.                        After obtaining informed consent, the endoscope was                         passed under direct vision. Throughout the procedure,                         the patient's blood pressure, pulse, and oxygen                         saturations were monitored continuously. The Endoscope  was introduced through the mouth, and advanced to the                         third part of duodenum. The upper GI endoscopy was                         accomplished with ease. The patient tolerated the                         procedure well. Findings:      The esophagus was  normal.      The examined duodenum was normal.      The cardia and gastric fundus were normal on retroflexion.      Diffuse moderately erythematous mucosa without bleeding was found in the       entire examined stomach. Biopsies were taken with a cold forceps for       histology. Impression:            - Normal esophagus.                        - Normal examined duodenum.                        - Erythematous mucosa in the stomach. Biopsied. Recommendation:        - Await pathology results.                        - Perform a colonoscopy today. Procedure Code(s):     --- Professional ---                        [phone], Esophagogastroduodenoscopy, flexible,                         transoral; with biopsy, single or multiple Diagnosis Code(s):     --- Professional ---                        K31.89, Other diseases of stomach and duodenum                        R10.9, Unspecified abdominal pain CPT copyright 2022 American Medical Association. All rights reserved. The codes documented in this report are preliminary and upon coder review may  be revised to meet current compliance requirements. Ruel Kung, MD Ruel Kung MD, MD 03/28/2024 9:14:56 AM This report has been signed electronically. Number of Addenda: 0 Note Initiated On: 03/28/2024 8:59 AM Estimated Blood Loss:  Estimated blood loss: none.      [facility]

## 2024-03-28 NOTE — Anesthesia Preprocedure Evaluation (Addendum)
 Anesthesia Evaluation  Patient identified by MRN, date of birth, ID band Patient awake    Reviewed: Allergy & Precautions, NPO status , Patient's Chart, lab work & pertinent test results  Airway Mallampati: III  TM Distance: >3 FB Neck ROM: full    Dental  (+) Upper Dentures   Pulmonary neg pulmonary ROS, sleep apnea , COPD,  COPD inhaler, former smoker   Pulmonary exam normal  + decreased breath sounds      Cardiovascular Exercise Tolerance: Good hypertension, negative cardio ROS Normal cardiovascular exam Rhythm:Regular Rate:Normal     Neuro/Psych  Headaches, Seizures -,   Anxiety     negative neurological ROS  negative psych ROS   GI/Hepatic negative GI ROS, Neg liver ROS,,,  Endo/Other  negative endocrine ROSdiabetes, Well Controlled, Type 2, Oral Hypoglycemic Agents  Class 4 obesity  Renal/GU negative Renal ROS  negative genitourinary   Musculoskeletal  (+) Arthritis ,    Abdominal  (+) + obese  Peds negative pediatric ROS (+)  Hematology negative hematology ROS (+)   Anesthesia Other Findings Past Medical History: No date: Arthritis No date: Cancer (HCC)     Comment:  left breast No date: COPD (chronic obstructive pulmonary disease) (HCC) No date: Diabetes mellitus without complication (HCC) No date: Dyspnea No date: Epilepsy (HCC)     Comment:  no siezures in 43 years No date: Pneumonia     Comment:  possibly beginning of November 2022-pt states md did not              do cxr but was treating it as pneumonia No date: Sleep apnea     Comment:  could not tolerate cpap  Past Surgical History: No date: ABDOMINAL HYSTERECTOMY No date: APPENDECTOMY No date: BREAST BIOPSY; Right     Comment:  benign 15 years ago 05/05/2021: BREAST BIOPSY; Left     Comment:  stereo bx-calcs/RIBBON clip-path pending No date: CHOLECYSTECTOMY No date: COLONOSCOPY No date: JOINT REPLACEMENT; Bilateral     Comment:   2005 and 2018 knees 05/26/2021: SIMPLE MASTECTOMY WITH AXILLARY SENTINEL NODE BIOPSY; Left     Comment:  Procedure: SIMPLE MASTECTOMY WITH AXILLARY SENTINEL NODE              BIOPSY;  Surgeon: Dessa Reyes ORN, MD;  Location: [facility]              ORS;  Service: General;  Laterality: Left;  BMI    Body Mass Index: 50.81 kg/m      Reproductive/Obstetrics negative OB ROS                              Anesthesia Physical Anesthesia Plan  ASA: 3  Anesthesia Plan: General   Post-op Pain Management:    Induction: Intravenous  PONV Risk Score and Plan: Propofol  infusion and TIVA  Airway Management Planned: Natural Airway and Nasal Cannula  Additional Equipment:   Intra-op Plan:   Post-operative Plan:   Informed Consent: I have reviewed the patients History and Physical, chart, labs and discussed the procedure including the risks, benefits and alternatives for the proposed anesthesia with the patient or authorized representative who has indicated his/her understanding and acceptance.     Dental Advisory Given  Plan Discussed with: CRNA  Anesthesia Plan Comments:         Anesthesia Quick Evaluation

## 2024-03-28 NOTE — Anesthesia Postprocedure Evaluation (Signed)
 Anesthesia Post Note  Patient: Cheryl Silva  Procedure(s) Performed: COLONOSCOPY EGD (ESOPHAGOGASTRODUODENOSCOPY) POLYPECTOMY, INTESTINE CONTROL OF HEMORRHAGE, GI TRACT, ENDOSCOPIC, BY CLIPPING OR OVERSEWING  Patient location during evaluation: PACU Anesthesia Type: General Level of consciousness: awake and awake and alert Pain management: satisfactory to patient Vital Signs Assessment: post-procedure vital signs reviewed and stable Respiratory status: spontaneous breathing Cardiovascular status: stable Anesthetic complications: no   There were no known notable events for this encounter.   Last Vitals:  Vitals:   03/28/24 0844  BP: (!) 102/50  Pulse: 67  Resp: 20  Temp: (!) 35.8 C  SpO2: 96%    Last Pain:  Vitals:   03/28/24 0844  TempSrc: Temporal                 VAN STAVEREN,Mansa Willers

## 2024-03-28 NOTE — Transfer of Care (Signed)
 Immediate Anesthesia Transfer of Care Note  Patient: AMRIT ERCK  Procedure(s) Performed: COLONOSCOPY EGD (ESOPHAGOGASTRODUODENOSCOPY) POLYPECTOMY, INTESTINE CONTROL OF HEMORRHAGE, GI TRACT, ENDOSCOPIC, BY CLIPPING OR OVERSEWING  Patient Location: PACU  Anesthesia Type:General  Level of Consciousness: awake and sedated  Airway & Oxygen Therapy: Patient Spontanous Breathing and Patient connected to face mask oxygen  Post-op Assessment: Report given to RN and Post -op Vital signs reviewed and stable  Post vital signs: Reviewed and stable  Last Vitals:  Vitals Value Taken Time  BP    Temp    Pulse    Resp    SpO2      Last Pain:  Vitals:   03/28/24 0844  TempSrc: Temporal         Complications: There were no known notable events for this encounter.

## 2024-03-29 ENCOUNTER — Other Ambulatory Visit: Payer: Self-pay | Admitting: Gastroenterology

## 2024-03-29 DIAGNOSIS — K838 Other specified diseases of biliary tract: Secondary | ICD-10-CM

## 2024-03-30 LAB — SURGICAL PATHOLOGY

## 2024-04-02 ENCOUNTER — Ambulatory Visit
Admission: RE | Admit: 2024-04-02 | Discharge: 2024-04-02 | Disposition: A | Discharge status: Home / Self Care | Source: Ambulatory Visit | Attending: Gastroenterology | Admitting: Gastroenterology

## 2024-04-02 ENCOUNTER — Other Ambulatory Visit: Payer: Self-pay | Admitting: Gastroenterology

## 2024-04-02 DIAGNOSIS — K838 Other specified diseases of biliary tract: Secondary | ICD-10-CM

## 2024-04-02 MED ORDER — GADOBUTROL 1 MMOL/ML IV SOLN
10.0000 mL | Freq: Once | INTRAVENOUS | Status: AC | PRN
  Administered 2024-04-02: 10 mL via INTRAVENOUS

## 2024-04-10 ENCOUNTER — Other Ambulatory Visit: Payer: Self-pay | Admitting: Internal Medicine

## 2024-04-10 DIAGNOSIS — C50312 Malignant neoplasm of lower-inner quadrant of left female breast: Secondary | ICD-10-CM

## 2024-04-18 ENCOUNTER — Ambulatory Visit
Admission: RE | Admit: 2024-04-18 | Discharge: 2024-04-18 | Disposition: A | Discharge status: Home / Self Care | Source: Ambulatory Visit | Attending: Internal Medicine | Admitting: Internal Medicine

## 2024-04-18 DIAGNOSIS — Z1231 Encounter for screening mammogram for malignant neoplasm of breast: Secondary | ICD-10-CM | POA: Diagnosis present

## 2024-04-18 DIAGNOSIS — Z170 Estrogen receptor positive status [ER+]: Secondary | ICD-10-CM | POA: Insufficient documentation

## 2024-04-18 DIAGNOSIS — C50312 Malignant neoplasm of lower-inner quadrant of left female breast: Secondary | ICD-10-CM | POA: Diagnosis not present

## 2024-06-23 ENCOUNTER — Other Ambulatory Visit: Payer: Self-pay | Admitting: Nurse Practitioner

## 2024-06-23 ENCOUNTER — Encounter: Payer: Self-pay | Admitting: Nurse Practitioner

## 2024-06-23 DIAGNOSIS — E785 Hyperlipidemia, unspecified: Secondary | ICD-10-CM

## 2024-06-23 DIAGNOSIS — Z8249 Family history of ischemic heart disease and other diseases of the circulatory system: Secondary | ICD-10-CM

## 2024-06-23 DIAGNOSIS — Z87891 Personal history of nicotine dependence: Secondary | ICD-10-CM

## 2024-06-23 DIAGNOSIS — R42 Dizziness and giddiness: Secondary | ICD-10-CM

## 2024-06-23 DIAGNOSIS — I10 Essential (primary) hypertension: Secondary | ICD-10-CM

## 2024-06-23 DIAGNOSIS — E1142 Type 2 diabetes mellitus with diabetic polyneuropathy: Secondary | ICD-10-CM

## 2024-06-23 DIAGNOSIS — R0602 Shortness of breath: Secondary | ICD-10-CM

## 2024-06-30 ENCOUNTER — Telehealth (HOSPITAL_COMMUNITY): Payer: Self-pay | Admitting: *Deleted

## 2024-06-30 NOTE — Telephone Encounter (Signed)
 Reaching out to patient to offer assistance regarding upcoming cardiac imaging study; pt verbalizes understanding of appt date/time, parking situation and where to check in, pre-test NPO status, and verified current allergies; name and call back number provided for further questions should they arise  Larey Brick RN Navigator Cardiac Imaging [facility] [phone] office [phone] cell

## 2024-07-03 ENCOUNTER — Ambulatory Visit
Admission: RE | Admit: 2024-07-03 | Discharge: 2024-07-03 | Disposition: A | Discharge status: Home / Self Care | Source: Ambulatory Visit | Attending: Nurse Practitioner | Admitting: Nurse Practitioner

## 2024-07-03 DIAGNOSIS — R42 Dizziness and giddiness: Secondary | ICD-10-CM | POA: Diagnosis present

## 2024-07-03 DIAGNOSIS — Z6841 Body Mass Index (BMI) 40.0 and over, adult: Secondary | ICD-10-CM | POA: Diagnosis present

## 2024-07-03 DIAGNOSIS — Z794 Long term (current) use of insulin: Secondary | ICD-10-CM | POA: Insufficient documentation

## 2024-07-03 DIAGNOSIS — I10 Essential (primary) hypertension: Secondary | ICD-10-CM | POA: Diagnosis present

## 2024-07-03 DIAGNOSIS — R0602 Shortness of breath: Secondary | ICD-10-CM | POA: Insufficient documentation

## 2024-07-03 DIAGNOSIS — Z87891 Personal history of nicotine dependence: Secondary | ICD-10-CM | POA: Diagnosis present

## 2024-07-03 DIAGNOSIS — Z8249 Family history of ischemic heart disease and other diseases of the circulatory system: Secondary | ICD-10-CM | POA: Diagnosis present

## 2024-07-03 DIAGNOSIS — E785 Hyperlipidemia, unspecified: Secondary | ICD-10-CM | POA: Insufficient documentation

## 2024-07-03 DIAGNOSIS — E1142 Type 2 diabetes mellitus with diabetic polyneuropathy: Secondary | ICD-10-CM | POA: Diagnosis present

## 2024-07-03 LAB — POCT I-STAT CREATININE: Creatinine, Ser: 0.7 mg/dL (ref 0.44–1.00)

## 2024-07-03 MED ORDER — METOPROLOL TARTRATE 5 MG/5ML IV SOLN
INTRAVENOUS | Status: AC
  Filled 2024-07-03: qty 10

## 2024-07-03 MED ORDER — IOHEXOL 350 MG/ML SOLN
100.0000 mL | Freq: Once | INTRAVENOUS | Status: AC | PRN
  Administered 2024-07-03: 100 mL via INTRAVENOUS

## 2024-07-03 MED ORDER — DILTIAZEM HCL 25 MG/5ML IV SOLN: 10.0000 mg | INTRAVENOUS | Status: DC | PRN

## 2024-07-03 MED ORDER — NITROGLYCERIN 0.4 MG SL SUBL
0.8000 mg | SUBLINGUAL_TABLET | Freq: Once | SUBLINGUAL | Status: AC
  Administered 2024-07-03: 0.8 mg via SUBLINGUAL
  Filled 2024-07-03: qty 25

## 2024-07-03 MED ORDER — METOPROLOL TARTRATE 5 MG/5ML IV SOLN
10.0000 mg | INTRAVENOUS | Status: DC | PRN
  Administered 2024-07-03: 10 mg via INTRAVENOUS
  Filled 2024-07-03: qty 10

## 2024-07-03 NOTE — Progress Notes (Signed)
 Patient tolerated procedure well. W/C to lobby.  Ambulate w/o difficulty. Denies light headedness or being dizzy. Encouraged to drink extra water today and reasoning explained. Verbalized understanding. All questions answered. ABC intact. No further needs. Discharge from procedure area w/o issues.

## 2024-07-03 NOTE — Progress Notes (Incomplete)
 Patient tolerated procedure well. W/C to lobby.  Ambulate w/o difficulty. Denies light headedness or being dizzy. Encouraged to drink extra water today and reasoning explained. Verbalized understanding. All questions answered. ABC intact. No further needs. Discharge from procedure area w/o issues.

## 2024-07-12 ENCOUNTER — Other Ambulatory Visit: Payer: Self-pay | Admitting: Nurse Practitioner

## 2024-07-12 DIAGNOSIS — C50312 Malignant neoplasm of lower-inner quadrant of left female breast: Secondary | ICD-10-CM

## 2024-09-11 ENCOUNTER — Other Ambulatory Visit

## 2024-09-11 ENCOUNTER — Ambulatory Visit: Admitting: Internal Medicine

## 2024-09-18 ENCOUNTER — Ambulatory Visit: Admit: 2024-09-18 | Admitting: Gastroenterology
